# Patient Record
Sex: Female | Born: 1995 | Race: Black or African American | Hispanic: No | Marital: Single | Smoking: Current some day smoker
Health system: Southern US, Community
[De-identification: ages and names within clinical notes are randomized; demographics above are authoritative.]

## PROBLEM LIST (undated history)

## (undated) DIAGNOSIS — J45909 Unspecified asthma, uncomplicated: Secondary | ICD-10-CM

## (undated) HISTORY — PX: KNEE ARTHROSCOPY: SUR90

---
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
## 2015-12-30 IMAGING — CR DG KNEE COMPLETE 4+V*L*
4 series · 4 of 4 positions shown · non-contrast
Comparison: 10/22/2010

CLINICAL DATA: Left knee pain following tripping injury today,
initial encounter

EXAM:
LEFT KNEE - COMPLETE 4+ VIEW

[x knee ap left (1 of 3)]
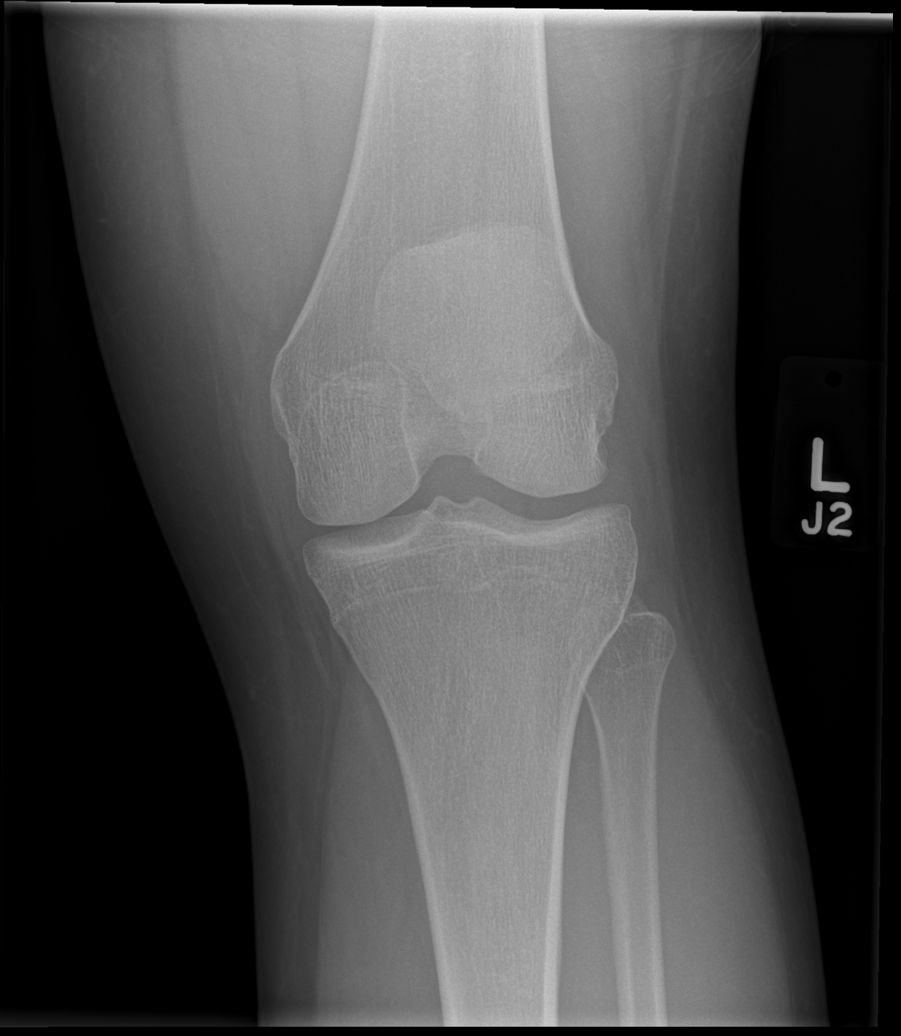

[x knee ap left (2 of 3)]
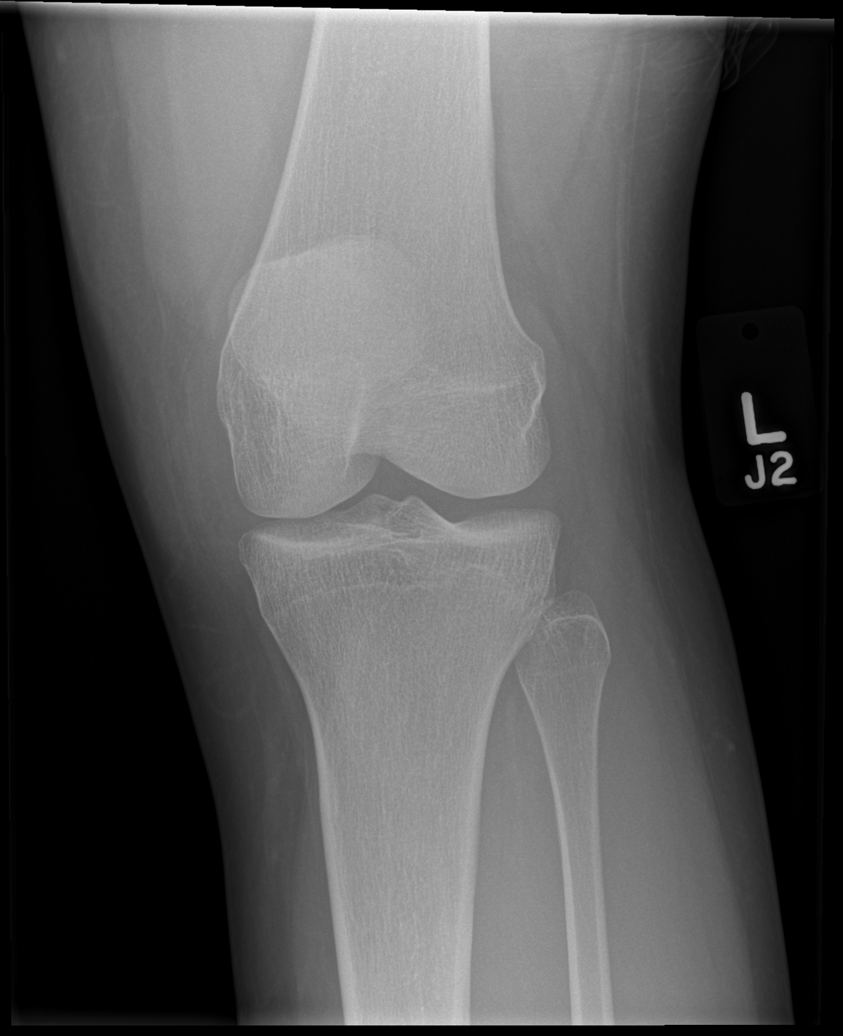

[x knee ap left (3 of 3)]
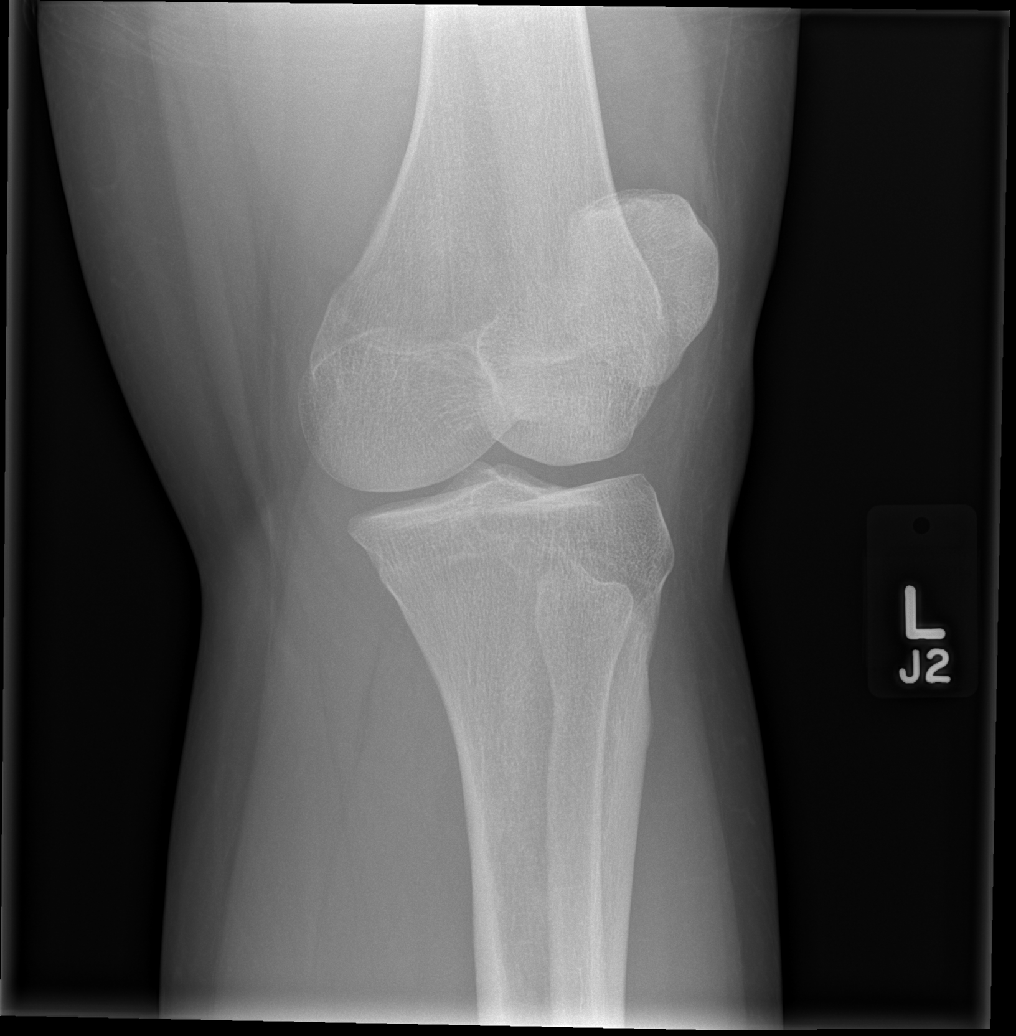

[x knee lat left]
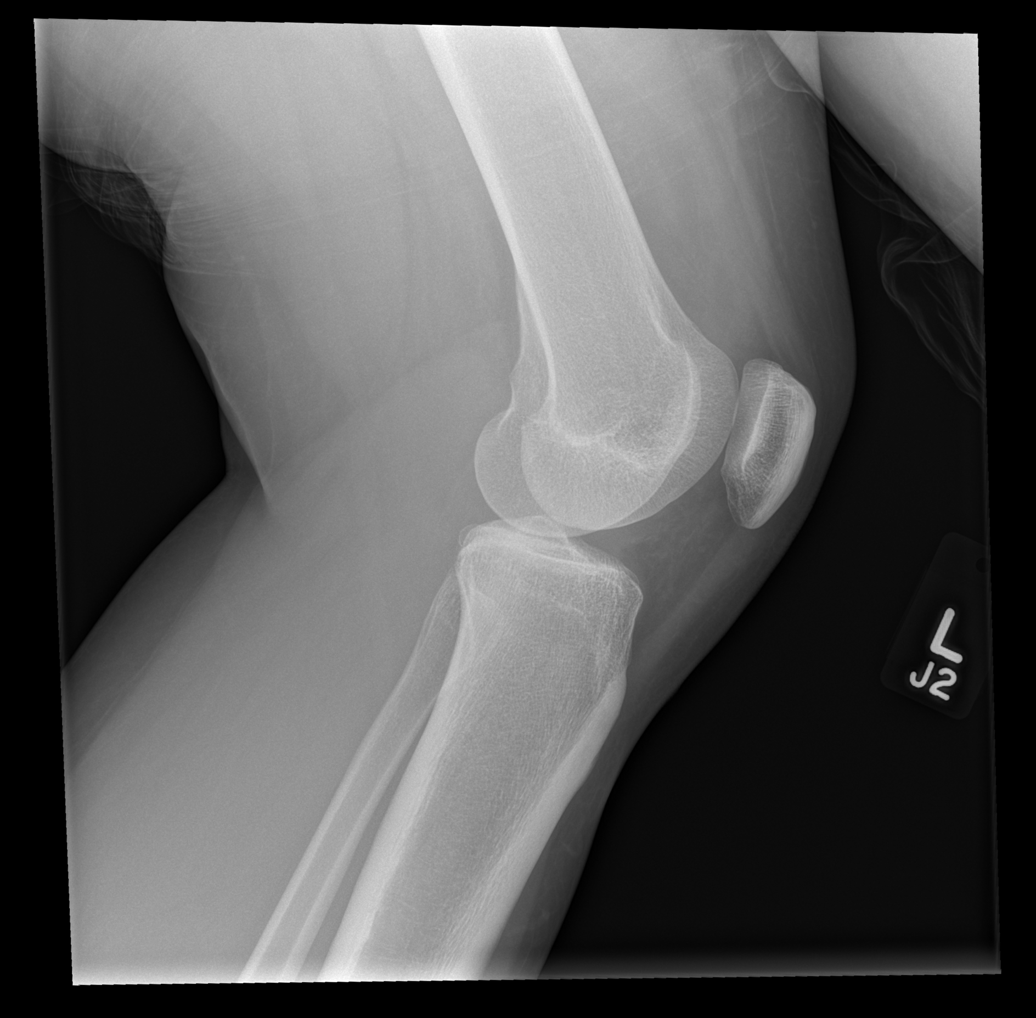

[4 of 4 positions shown; findings below may reference images not displayed]

FINDINGS: There is no evidence of fracture, dislocation, or joint effusion.
There is no evidence of arthropathy or other focal bone abnormality.
Soft tissues are unremarkable.
IMPRESSION: No acute abnormality noted.

## 2016-02-10 ENCOUNTER — Encounter (HOSPITAL_COMMUNITY): Payer: Self-pay

## 2016-02-10 DIAGNOSIS — F17200 Nicotine dependence, unspecified, uncomplicated: Secondary | ICD-10-CM | POA: Diagnosis not present

## 2016-02-10 DIAGNOSIS — R05 Cough: Secondary | ICD-10-CM | POA: Insufficient documentation

## 2016-02-10 DIAGNOSIS — R112 Nausea with vomiting, unspecified: Secondary | ICD-10-CM | POA: Insufficient documentation

## 2016-02-10 DIAGNOSIS — R740 Nonspecific elevation of levels of transaminase and lactic acid dehydrogenase [LDH]: Secondary | ICD-10-CM | POA: Insufficient documentation

## 2016-02-10 LAB — POC URINE PREG, ED: PREG TEST UR: NEGATIVE

## 2016-02-10 MED ORDER — ONDANSETRON 4 MG PO TBDP
ORAL_TABLET | ORAL | Status: AC
  Filled 2016-02-10: qty 1

## 2016-02-10 MED ORDER — ONDANSETRON 4 MG PO TBDP
4.0000 mg | ORAL_TABLET | Freq: Once | ORAL | Status: AC | PRN
  Administered 2016-02-10: 4 mg via ORAL

## 2016-02-10 NOTE — ED Triage Notes (Signed)
Pt states she has been having productive cough x 2 weeks; pt states OTC has not worked; Pt believes she has the flu; pt c/o body aches and hot flashes; Pt states pain at 7/10 on arrival. Pt a&ox 4; pt states vomiting x 4 in 24 hours.

## 2016-02-11 ENCOUNTER — Emergency Department (HOSPITAL_COMMUNITY): Payer: BLUE CROSS/BLUE SHIELD

## 2016-02-11 ENCOUNTER — Emergency Department (HOSPITAL_COMMUNITY)
Admission: EM | Admit: 2016-02-11 | Discharge: 2016-02-11 | Disposition: A | Discharge status: Home / Self Care | Payer: BLUE CROSS/BLUE SHIELD | Attending: Emergency Medicine | Admitting: Emergency Medicine

## 2016-02-11 DIAGNOSIS — R7401 Elevation of levels of liver transaminase levels: Secondary | ICD-10-CM

## 2016-02-11 DIAGNOSIS — R059 Cough, unspecified: Secondary | ICD-10-CM

## 2016-02-11 DIAGNOSIS — R05 Cough: Secondary | ICD-10-CM

## 2016-02-11 DIAGNOSIS — R740 Nonspecific elevation of levels of transaminase and lactic acid dehydrogenase [LDH]: Secondary | ICD-10-CM

## 2016-02-11 DIAGNOSIS — R110 Nausea: Secondary | ICD-10-CM

## 2016-02-11 DIAGNOSIS — R6889 Other general symptoms and signs: Secondary | ICD-10-CM

## 2016-02-11 LAB — COMPREHENSIVE METABOLIC PANEL
ALK PHOS: 47 U/L (ref 38–126)
ALT: 140 U/L — ABNORMAL HIGH (ref 14–54)
AST: 199 U/L — AB (ref 15–41)
Albumin: 4 g/dL (ref 3.5–5.0)
Anion gap: 12 (ref 5–15)
BILIRUBIN TOTAL: 0.6 mg/dL (ref 0.3–1.2)
BUN: 9 mg/dL (ref 6–20)
CALCIUM: 9.5 mg/dL (ref 8.9–10.3)
CO2: 27 mmol/L (ref 22–32)
Chloride: 101 mmol/L (ref 101–111)
Creatinine, Ser: 1.12 mg/dL — ABNORMAL HIGH (ref 0.44–1.00)
GFR calc Af Amer: >60 mL/min (ref >60)
GFR calc non Af Amer: >60 mL/min (ref >60)
Glucose, Bld: 106 mg/dL — ABNORMAL HIGH (ref 65–99)
POTASSIUM: 3.6 mmol/L (ref 3.5–5.1)
Sodium: 140 mmol/L (ref 135–145)
TOTAL PROTEIN: 7.1 g/dL (ref 6.5–8.1)

## 2016-02-11 LAB — CBC
HEMATOCRIT: 36.8 % (ref 36.0–46.0)
Hemoglobin: 12.6 g/dL (ref 12.0–15.0)
MCH: 29.6 pg (ref 26.0–34.0)
MCHC: 34.2 g/dL (ref 30.0–36.0)
MCV: 86.4 fL (ref 78.0–100.0)
PLATELETS: 191 10*3/uL (ref 150–400)
RBC: 4.26 MIL/uL (ref 3.87–5.11)
RDW: 13.9 % (ref 11.5–15.5)
WBC: 5 10*3/uL (ref 4.0–10.5)

## 2016-02-11 MED ORDER — ONDANSETRON HCL 4 MG/2ML IJ SOLN
4.0000 mg | Freq: Once | INTRAMUSCULAR | Status: AC
  Administered 2016-02-11: 4 mg via INTRAVENOUS
  Filled 2016-02-11: qty 2

## 2016-02-11 MED ORDER — SODIUM CHLORIDE 0.9 % IV BOLUS (SEPSIS)
1000.0000 mL | Freq: Once | INTRAVENOUS | Status: AC
  Administered 2016-02-11: 1000 mL via INTRAVENOUS

## 2016-02-11 MED ORDER — ONDANSETRON HCL 4 MG PO TABS: 4.0000 mg | ORAL_TABLET | Freq: Four times a day (QID) | ORAL | 0 refills | Status: DC | PRN

## 2016-02-11 NOTE — Discharge Instructions (Signed)
Your blood tests showed elevations of some enzymes from the liver. This may indicate hepatitis. Hepatitis could be from the flu, but you have had blood drawn to look for other causes of hepatitis. Please make sure to take plenty of fluids. Take the ondansetron as needed for nausea. You may take ibuprofen or naproxen as needed for fever or aching. Do not take acetaminophen (Tylenol) because that can be harmful to the liver. Please be careful, because many cough, cold, flu medications have acetaminophen in them. Do not take any medications which contain acetaminophen. Follow-up with your doctor next week to evaluate the hepatitis tests which were drawn today. Return if your symptoms are getting worse.

## 2016-02-11 NOTE — ED Provider Notes (Signed)
[facility]-EMERGENCY DEPT Provider Note   CSN: [id number] Arrival date & time: 02/10/16  2313  By signing my name below, I, Freida Busman, attest that this documentation has been prepared under the direction and in the presence of Dione Booze, MD . Electronically Signed: Freida Busman, Scribe. 02/11/2016. 1:30 AM.  History   Chief Complaint Chief Complaint  Patient presents with  . Influenza    The history is provided by the patient and medical records. No language interpreter was used.    HPI Comments:  Caroline Richardson is a 21 y.o. female who presents to the Emergency Department complaining of a productive cough with grey colored phlegm x 2 weeks. She reports associated nausea, vomiting, chills, subjective fever, generalized  body aches, and decreased appetite. No recent sick contacts. No alleviating factors noted.    History reviewed. No pertinent past medical history.  There are no active problems to display for this patient.   Past Surgical History:  Procedure Laterality Date  . ARTHROSCOPIC REPAIR ACL Right 2012    OB History    No data available       Home Medications    Prior to Admission medications   Medication Sig Start Date End Date Taking? Authorizing Provider  azithromycin (ZITHROMAX) 250 MG tablet Take 1 tablet (250 mg total) by mouth daily. Take first 2 tablets together, then 1 every day until finished. 12/31/14   Stevi Barrett, PA-C  Chlorphen-Pseudoephed-APAP (THERAFLU FLU/COLD PO) Take by mouth as directed.    Historical Provider, MD  guaifenesin (ROBITUSSIN) 100 MG/5ML syrup Take 200 mg by mouth 3 (three) times daily as needed for cough.    Historical Provider, MD    Family History No family history on file.  Social History Social History  Substance Use Topics  . Smoking status: Current Some Day Smoker  . Smokeless tobacco: Not on file  . Alcohol use No     Allergies   Patient has no known allergies.   Review of Systems Review of Systems    Constitutional: Positive for appetite change, chills and fever.  Respiratory: Positive for cough.   Gastrointestinal: Positive for nausea and vomiting.  Musculoskeletal: Positive for myalgias.  All other systems reviewed and are negative.   Physical Exam Updated Vital Signs BP 96/66 (BP Location: Right Arm)   Pulse 108   Temp 100.1 F (37.8 C) (Oral)   Resp 18   LMP 01/25/2016   SpO2 100%   Physical Exam  Constitutional: She is oriented to person, place, and time. She appears well-developed and well-nourished.  HENT:  Head: Normocephalic and atraumatic.  Tender over frontal and maxillay sinuses   Eyes: EOM are normal. Pupils are equal, round, and reactive to light.  Neck: Normal range of motion. Neck supple. No JVD present.  Cardiovascular: Regular rhythm and normal heart sounds.  Tachycardia present.   No murmur heard. Pulmonary/Chest: Effort normal and breath sounds normal. She has no wheezes. She has no rales. She exhibits no tenderness.  Abdominal: Soft. Bowel sounds are normal. She exhibits no distension and no mass. There is no tenderness.  Musculoskeletal: Normal range of motion. She exhibits no edema.  Lymphadenopathy:    She has no cervical adenopathy.  Neurological: She is alert and oriented to person, place, and time. No cranial nerve deficit. She exhibits normal muscle tone. Coordination normal.  Skin: Skin is warm and dry. No rash noted.  Psychiatric: She has a normal mood and affect. Her behavior is normal. Judgment and thought content  normal.  Nursing note and vitals reviewed.    ED Treatments / Results  DIAGNOSTIC STUDIES:  Oxygen Saturation is 100% on RA, normal by my interpretation.    COORDINATION OF CARE:  1:28 AM Discussed treatment plan with pt at bedside and pt agreed to plan.  Labs (all labs ordered are listed, but only abnormal results are displayed) Labs Reviewed  COMPREHENSIVE METABOLIC PANEL - Abnormal; Notable for the following:        Result Value   Glucose, Bld 106 (*)    Creatinine, Ser 1.12 (*)    AST 199 (*)    ALT 140 (*)    All other components within normal limits  CBC  POC URINE PREG, ED    Radiology Dg Chest 2 View  Result Date: 02/11/2016 CLINICAL DATA:  Productive cough for 2 weeks. EXAM: CHEST  2 VIEW COMPARISON:  12/31/2014 FINDINGS: The heart size and mediastinal contours are within normal limits. Both lungs are clear. The visualized skeletal structures are unremarkable. IMPRESSION: No active cardiopulmonary disease. Electronically Signed   By: Ellery Plunkaniel R Mitchell M.D.   On: 02/11/2016 01:58    Procedures Procedures (including critical care time)  Medications Ordered in ED Medications  ondansetron (ZOFRAN-ODT) 4 MG disintegrating tablet (not administered)  ondansetron (ZOFRAN-ODT) disintegrating tablet 4 mg (4 mg Oral Given 02/10/16 2331)     Initial Impression / Assessment and Plan / ED Course  I have reviewed the triage vital signs and the nursing notes.  Pertinent labs & imaging results that were available during my care of the patient were reviewed by me and considered in my medical decision making (see chart for details).  Flulike illness with persistent cough. Chest x-ray shows no evidence of pneumonia. Screening labs have been ordered prior to my seeing the patient and she does have modest elevation of AST and ALT. This could conceivably be from influenza, but she needs to be evaluated for possible viral hepatitis. Hepatitis panel has been drawn per G is given a bolus of intravenous fluids and ondansetron with significant subjective improvement. She is discharged with prescription for ondansetron and is to follow-up with her PCP to discuss results of hepatitis panel. Old records are reviewed, and she does have a prior ED visit for an upper respiratory infection 2 months ago. No previous liver enzymes on record.  Final Clinical Impressions(s) / ED Diagnoses   Final diagnoses:  Flu-like symptoms   Cough  Nausea  Elevated transaminase level    New Prescriptions New Prescriptions   No medications on file   I personally performed the services described in this documentation, which was scribed in my presence. The recorded information has been reviewed and is accurate.       Dione Boozeavid Safa Derner, MD 02/11/16 [phone]

## 2016-02-12 LAB — HEPATITIS PANEL, ACUTE
HCV Ab: <0.1 s/co ratio (ref 0.0–0.9)
Hep A IgM: NEGATIVE
Hep B C IgM: NEGATIVE
Hepatitis B Surface Ag: NEGATIVE

## 2016-03-09 ENCOUNTER — Telehealth (INDEPENDENT_AMBULATORY_CARE_PROVIDER_SITE_OTHER): Payer: Self-pay | Admitting: Orthopedic Surgery

## 2016-03-09 NOTE — Telephone Encounter (Signed)
I received vm from patients mother Caroline Richardson 2/23 checking on records request she stated she dropped off form "12" days ago.  [employer] doesn't have a release form.  I returned call 2/26 and left msg on vm asking to rmc

## 2017-02-07 IMAGING — CR DG CHEST 2V
2 series · 2 of 2 positions shown · non-contrast
Comparison: Physis 23/20 16; 02/25/2013; 03/03/2008

CLINICAL DATA: Cough congestion and generalized chest pain for 1
month. History of smoking.

EXAM:
CHEST  2 VIEW

[w chest pa]
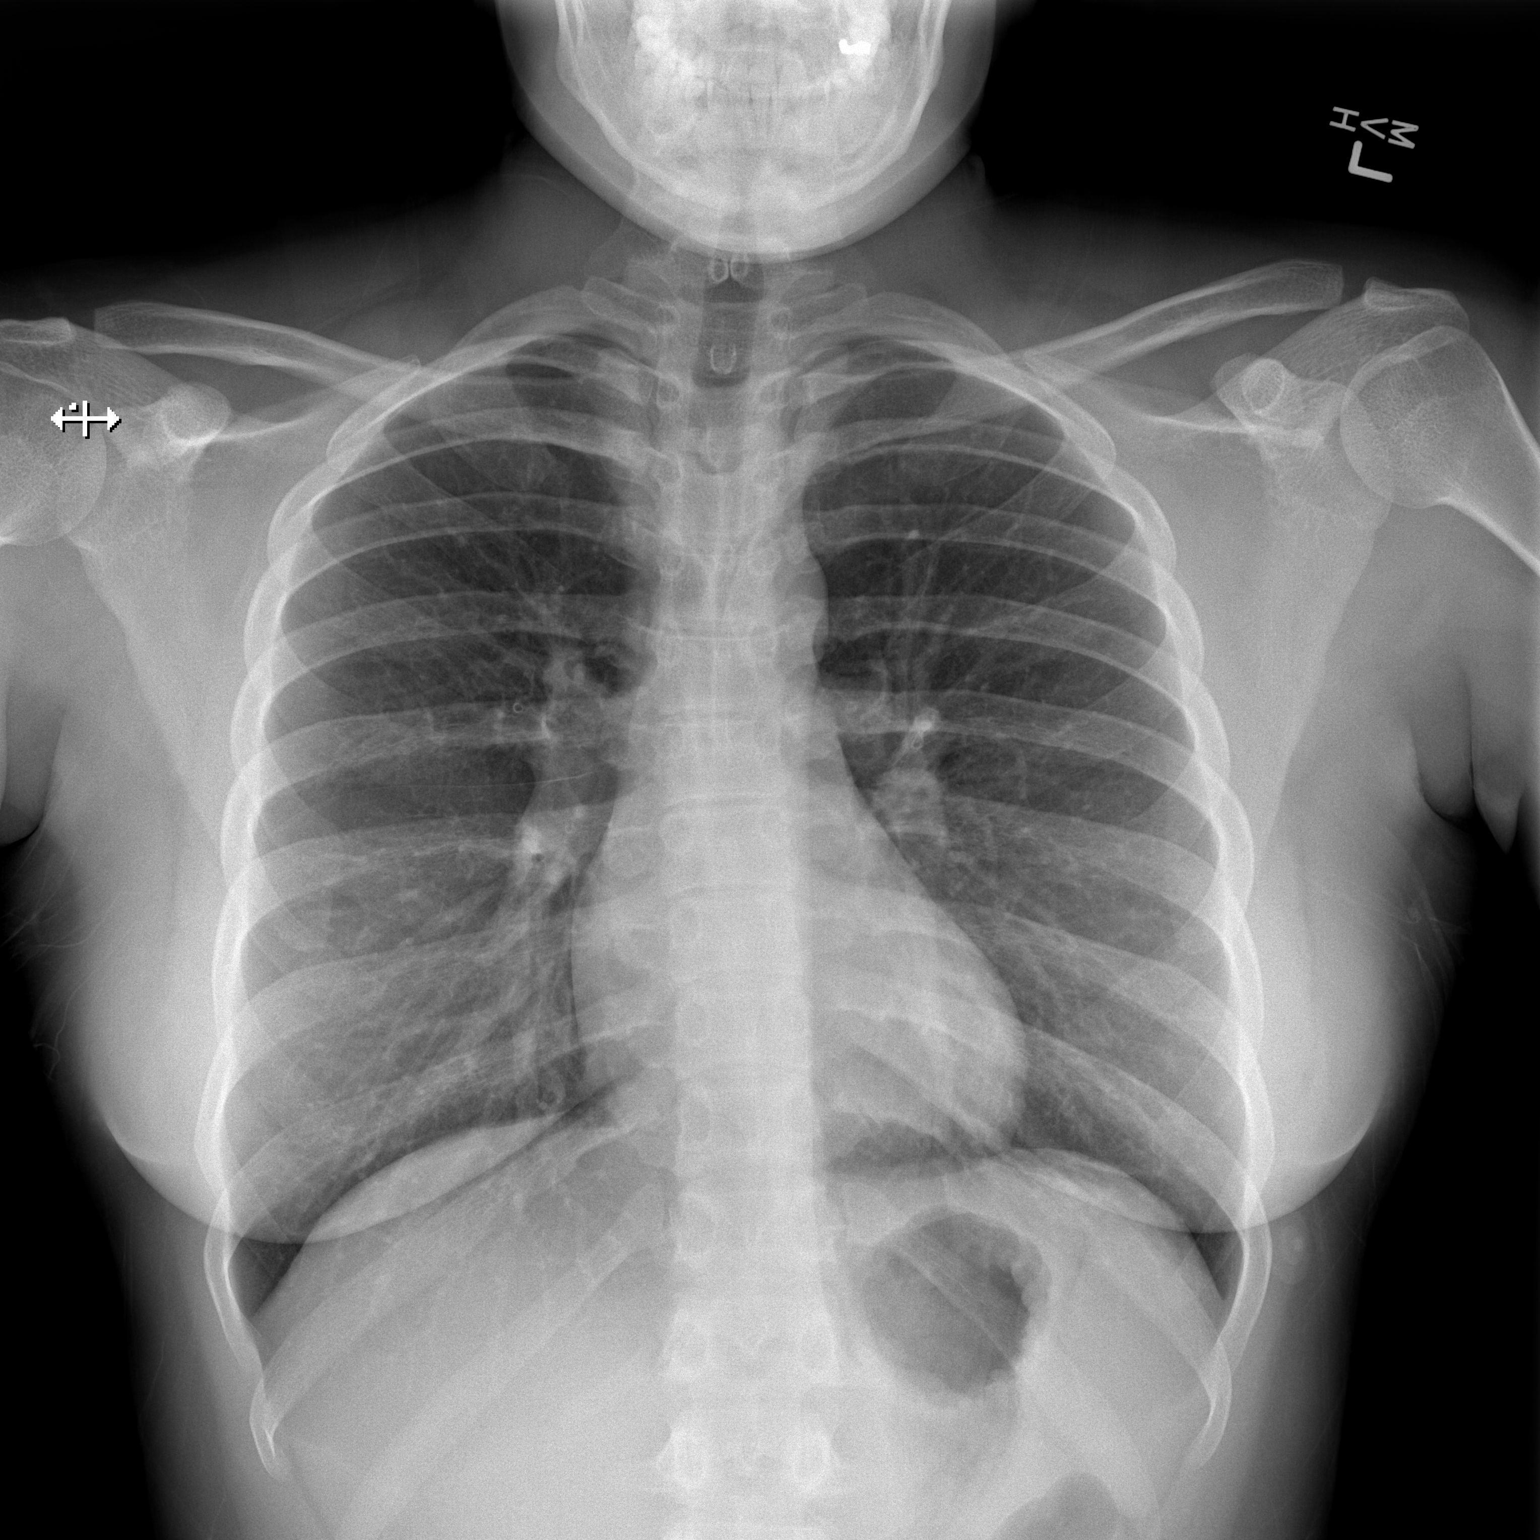

[w chest lat]
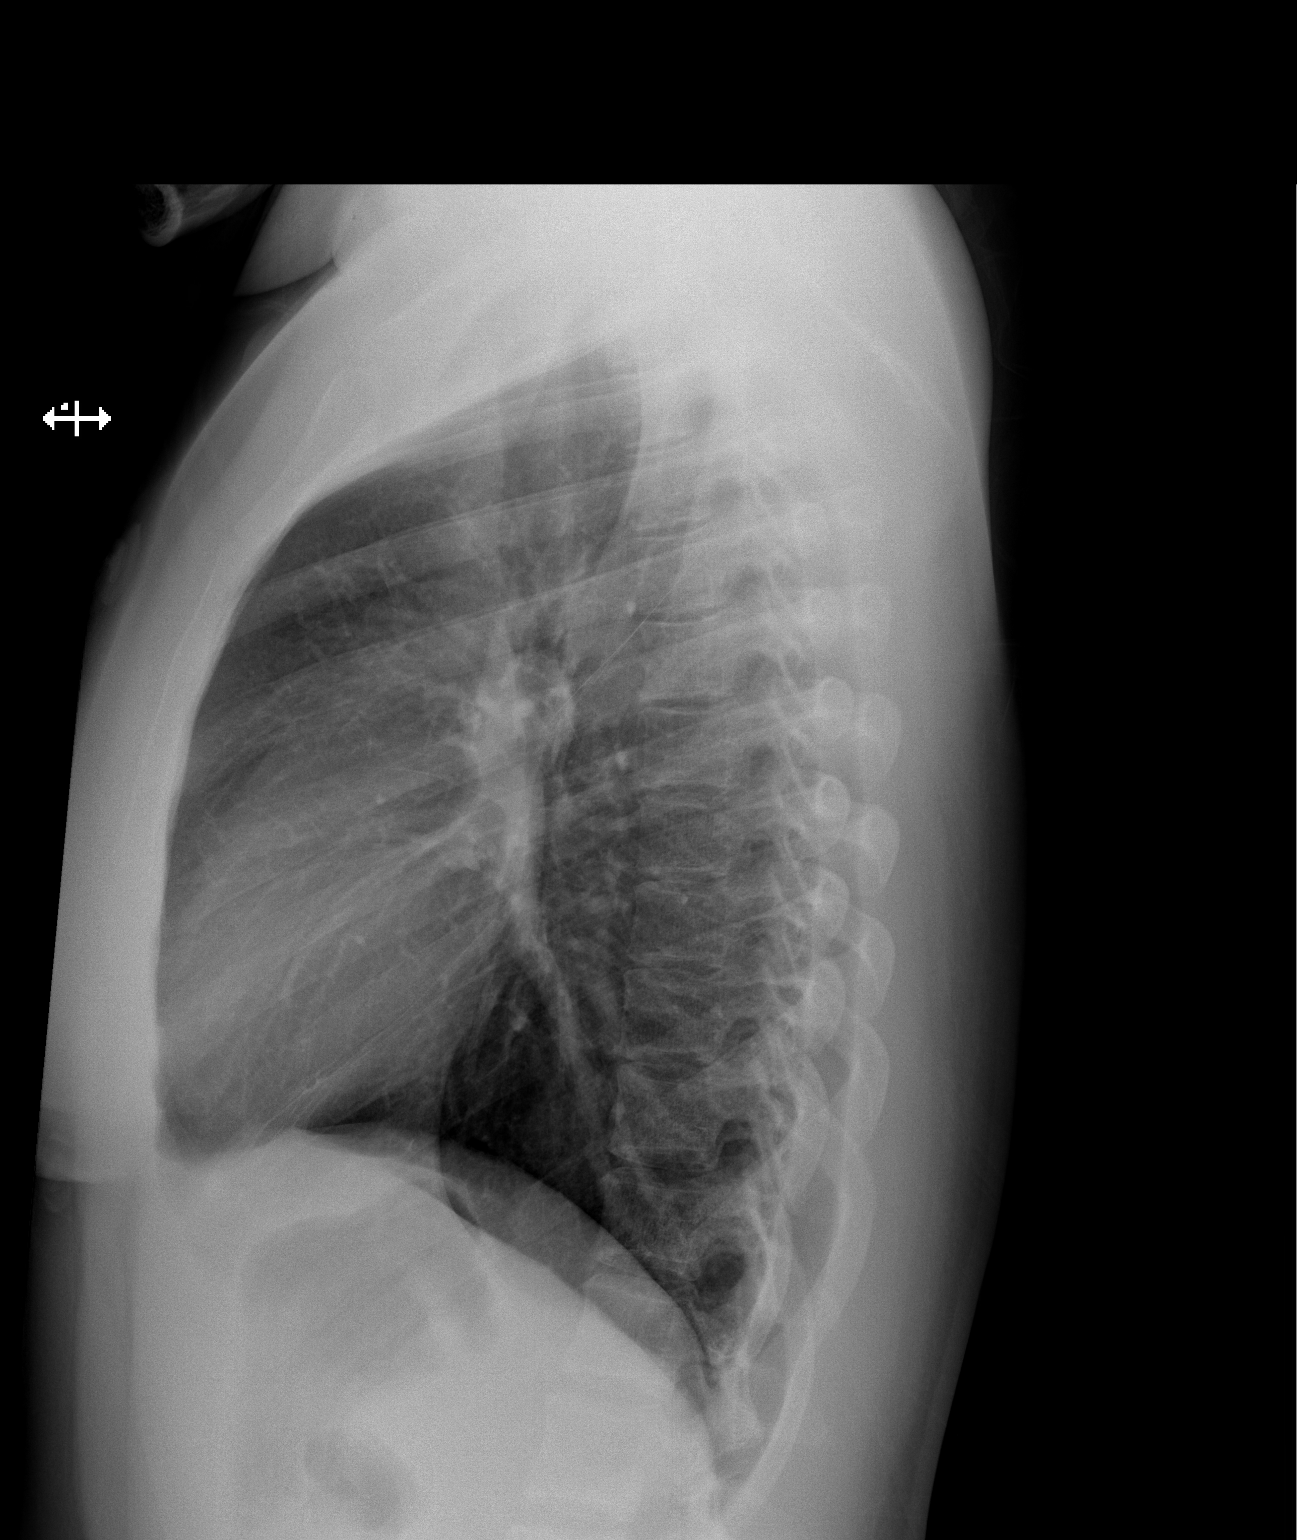

[2 of 2 positions shown; findings below may reference images not displayed]

FINDINGS: Grossly unchanged cardiac silhouette and mediastinal contours. No
focal parenchymal opacities. No pleural effusion or pneumothorax. No
evidence of edema. No acute osseus abnormalities. There is suspected
congenital non fusion involving the posterior elements of the
approximate C7 vertebral body.
IMPRESSION: No acute cardiopulmonary disease. Specifically, no evidence of
pneumonia.

## 2017-12-05 ENCOUNTER — Emergency Department (HOSPITAL_COMMUNITY): Payer: Self-pay

## 2017-12-05 ENCOUNTER — Other Ambulatory Visit: Payer: Self-pay

## 2017-12-05 ENCOUNTER — Encounter (HOSPITAL_COMMUNITY): Payer: Self-pay | Admitting: Family Medicine

## 2017-12-05 DIAGNOSIS — F17290 Nicotine dependence, other tobacco product, uncomplicated: Secondary | ICD-10-CM | POA: Insufficient documentation

## 2017-12-05 DIAGNOSIS — J40 Bronchitis, not specified as acute or chronic: Secondary | ICD-10-CM | POA: Insufficient documentation

## 2017-12-05 DIAGNOSIS — Z79899 Other long term (current) drug therapy: Secondary | ICD-10-CM | POA: Insufficient documentation

## 2017-12-05 DIAGNOSIS — R0789 Other chest pain: Secondary | ICD-10-CM | POA: Insufficient documentation

## 2017-12-05 LAB — I-STAT BETA HCG BLOOD, ED (NOT ORDERABLE)

## 2017-12-05 LAB — POCT I-STAT TROPONIN I: Troponin i, poc: 0 ng/mL (ref 0.00–0.08)

## 2017-12-05 LAB — CBC
HEMATOCRIT: 38.6 % (ref 36.0–46.0)
Hemoglobin: 12.7 g/dL (ref 12.0–15.0)
MCH: 30 pg (ref 26.0–34.0)
MCHC: 32.9 g/dL (ref 30.0–36.0)
MCV: 91.3 fL (ref 80.0–100.0)
NRBC: 0 % (ref 0.0–0.2)
Platelets: 242 10*3/uL (ref 150–400)
RBC: 4.23 MIL/uL (ref 3.87–5.11)
RDW: 12.7 % (ref 11.5–15.5)
WBC: 6.2 10*3/uL (ref 4.0–10.5)

## 2017-12-05 NOTE — ED Triage Notes (Signed)
Patient is complaining of intermittent, mid-sternal chest pain that started on Friday. Describes pain as pressure with no radiation. Associated symptoms are shortness of breath, back pain, lightheadedness, and diaphoresis. Risk factors of smoking history. Reports taking Tylenol and Ibuprofen with mild relief. Patient appears in acute distress and able to ambulate with a steady gait from the lobby to triage room 2.

## 2017-12-06 ENCOUNTER — Emergency Department (HOSPITAL_COMMUNITY)
Admission: EM | Admit: 2017-12-06 | Discharge: 2017-12-06 | Disposition: A | Discharge status: Home / Self Care | Payer: Self-pay | Attending: Emergency Medicine | Admitting: Emergency Medicine

## 2017-12-06 DIAGNOSIS — R0789 Other chest pain: Secondary | ICD-10-CM

## 2017-12-06 DIAGNOSIS — J40 Bronchitis, not specified as acute or chronic: Secondary | ICD-10-CM

## 2017-12-06 LAB — BASIC METABOLIC PANEL
Anion gap: 6 (ref 5–15)
BUN: 12 mg/dL (ref 6–20)
CHLORIDE: 109 mmol/L (ref 98–111)
CO2: 27 mmol/L (ref 22–32)
Calcium: 9 mg/dL (ref 8.9–10.3)
Creatinine, Ser: 1.04 mg/dL — ABNORMAL HIGH (ref 0.44–1.00)
GFR calc non Af Amer: >60 mL/min (ref >60)
Glucose, Bld: 108 mg/dL — ABNORMAL HIGH (ref 70–99)
POTASSIUM: 3.8 mmol/L (ref 3.5–5.1)
SODIUM: 142 mmol/L (ref 135–145)

## 2017-12-06 MED ORDER — ALBUTEROL SULFATE HFA 108 (90 BASE) MCG/ACT IN AERS: 2.0000 | INHALATION_SPRAY | RESPIRATORY_TRACT | 0 refills | Status: DC | PRN

## 2017-12-06 MED ORDER — NAPROXEN 500 MG PO TABS
500.0000 mg | ORAL_TABLET | Freq: Once | ORAL | Status: AC
  Administered 2017-12-06: 500 mg via ORAL
  Filled 2017-12-06: qty 1

## 2017-12-06 MED ORDER — NAPROXEN 500 MG PO TABS: 500.0000 mg | ORAL_TABLET | Freq: Two times a day (BID) | ORAL | 0 refills | Status: DC

## 2017-12-06 MED ORDER — ALBUTEROL SULFATE HFA 108 (90 BASE) MCG/ACT IN AERS
2.0000 | INHALATION_SPRAY | Freq: Once | RESPIRATORY_TRACT | Status: AC
  Administered 2017-12-06: 2 via RESPIRATORY_TRACT
  Filled 2017-12-06: qty 6.7

## 2017-12-06 NOTE — ED Provider Notes (Signed)
[facility]-EMERGENCY DEPT Provider Note   CSN: [id number] Arrival date & time: 12/05/17  2226     History   Chief Complaint Chief Complaint  Patient presents with  . Chest Pain    HPI Caroline Richardson is a 22 y.o. female.  HPI  This is a 22 year old female who presents with chest pain.  Patient reports onset of anterior nonradiating chest pain on Friday.  It is intermittent.  She describes it as pressure at times.  He has developed a nonproductive cough.  No fevers.  Denies any leg swelling or history of blood clots.  She has taken some over-the-counter medications with some relief including ibuprofen.  Currently she rates her pain at 5 out of 10.  Denies any worsening of pain with deep breathing.  She states that she has had some shortness of breath with her upper respiratory symptoms.  She is concerned she may have pneumonia.  She is a smoker.  History reviewed. No pertinent past medical history.  There are no active problems to display for this patient.   Past Surgical History:  Procedure Laterality Date  . ARTHROSCOPIC REPAIR ACL Right 2012     OB History   None      Home Medications    Prior to Admission medications   Medication Sig Start Date End Date Taking? Authorizing Provider  albuterol (PROVENTIL HFA;VENTOLIN HFA) 108 (90 Base) MCG/ACT inhaler Inhale 2 puffs into the lungs every 4 (four) hours as needed for wheezing or shortness of breath. 12/06/17   Horton, Mayer Maskerourtney F, MD  azithromycin (ZITHROMAX) 250 MG tablet Take 1 tablet (250 mg total) by mouth daily. Take first 2 tablets together, then 1 every day until finished. 12/31/14   Barrett, Rolm GalaStevi, PA-C  Chlorphen-Pseudoephed-APAP (THERAFLU FLU/COLD PO) Take by mouth as directed.    [provider]  guaifenesin (ROBITUSSIN) 100 MG/5ML syrup Take 200 mg by mouth 3 (three) times daily as needed for cough.    [provider]  naproxen (NAPROSYN) 500 MG tablet Take 1 tablet  (500 mg total) by mouth 2 (two) times daily. 12/06/17   Horton, Mayer Maskerourtney F, MD  ondansetron (ZOFRAN) 4 MG tablet Take 1 tablet (4 mg total) by mouth every 6 (six) hours as needed for nausea or vomiting. 02/11/16   Dione BoozeGlick, David, MD    Family History History reviewed. No pertinent family history.  Social History Social History   Tobacco Use  . Smoking status: Current Some Day Smoker    Types: Cigars  . Smokeless tobacco: Never Used  Substance Use Topics  . Alcohol use: No  . Drug use: No     Allergies   Patient has no known allergies.   Review of Systems Review of Systems  Constitutional: Negative for fever.  Respiratory: Positive for cough and shortness of breath.   Cardiovascular: Positive for chest pain. Negative for leg swelling.  Gastrointestinal: Negative for abdominal pain, nausea and vomiting.  Genitourinary: Negative for dysuria.  Neurological: Negative for weakness and numbness.  All other systems reviewed and are negative.    Physical Exam Updated Vital Signs BP 98/75 (BP Location: Left Arm)   Pulse 71   Temp 98 F (36.7 C) (Oral)   Resp 17   Ht 1.676 m (5\' 6" )   Wt 72.6 kg   SpO2 100%   BMI 25.82 kg/m   Physical Exam  Constitutional: She is oriented to person, place, and time. She appears well-developed and well-nourished. No distress.  HENT:  Head: Normocephalic and atraumatic.  Eyes: Pupils are equal, round, and reactive to light.  Neck: Neck supple.  Cardiovascular: Normal rate, regular rhythm, normal heart sounds and normal pulses.  Pulmonary/Chest: Effort normal. No respiratory distress. She has no wheezes.  Abdominal: Soft. Bowel sounds are normal. There is no tenderness.  Musculoskeletal:       Right lower leg: She exhibits no tenderness and no edema.       Left lower leg: She exhibits no tenderness and no edema.  Neurological: She is alert and oriented to person, place, and time.  Skin: Skin is warm and dry.  Psychiatric: She has a  normal mood and affect.  Nursing note and vitals reviewed.    ED Treatments / Results  Labs (all labs ordered are listed, but only abnormal results are displayed) Labs Reviewed  BASIC METABOLIC PANEL - Abnormal; Notable for the following components:      Result Value   Glucose, Bld 108 (*)    Creatinine, Ser 1.04 (*)    All other components within normal limits  CBC  I-STAT TROPONIN, ED  I-STAT BETA HCG BLOOD, ED ([facility], [facility], [facility] ONLY)  POCT I-STAT TROPONIN I  I-STAT BETA HCG BLOOD, ED (NOT ORDERABLE)    EKG EKG Interpretation  Date/Time:  Monday December 05 2017 23:28:59 EST Ventricular Rate:  73 PR Interval:    QRS Duration: 93 QT Interval:  366 QTC Calculation: 404 R Axis:   43 Text Interpretation:  Sinus rhythm RSR' in V1 or V2, probably normal variant Borderline T abnormalities, anterior leads Baseline wander in lead(s) V2 Nonspecific T wave abnormality Confirmed by Ross Marcus ([id number]) on 12/06/2017 1:50:03 AM   Radiology Dg Chest 2 View  Result Date: 12/06/2017 CLINICAL DATA:  Chest pain EXAM: CHEST - 2 VIEW COMPARISON:  02/11/2016 FINDINGS: The heart size and mediastinal contours are within normal limits. Both lungs are clear. The visualized skeletal structures are unremarkable. IMPRESSION: No active cardiopulmonary disease. Electronically Signed   By: Jasmine Pang M.D.   On: 12/06/2017 00:02    Procedures Procedures (including critical care time)  Medications Ordered in ED Medications  naproxen (NAPROSYN) tablet 500 mg (has no administration in time range)  albuterol (PROVENTIL HFA;VENTOLIN HFA) 108 (90 Base) MCG/ACT inhaler 2 puff (has no administration in time range)     Initial Impression / Assessment and Plan / ED Course  I have reviewed the triage vital signs and the nursing notes.  Pertinent labs & imaging results that were available during my care of the patient were reviewed by me and considered in my medical decision making (see chart for  details).     Patient presents with chest pain.  Ongoing since Friday.  She is overall nontoxic-appearing vital signs are reassuring.  She is on a percent on room air.  No respiratory distress.  Does report some upper respiratory symptoms including cough.  She is a smoker.  Her physical exam is benign.  EKG shows no signs of ischemia or arrhythmia.  Chest x-ray shows no evidence of pneumothorax or pneumonia.  Troponin is negative.  Lab work-up is otherwise unremarkable.  Suspect upper respiratory infection likely viral versus bronchitis.  She is low risk and PERC negative so doubt PE.  She was given naproxen and an inhaler given her smoking history.  Will treat supportively.  Patient stated understanding.  After history, exam, and medical workup I feel the patient has been appropriately medically screened and is safe for discharge home. Pertinent diagnoses  were discussed with the patient. Patient was given return precautions.   Final Clinical Impressions(s) / ED Diagnoses   Final diagnoses:  Atypical chest pain  Bronchitis    ED Discharge Orders         Ordered    naproxen (NAPROSYN) 500 MG tablet  2 times daily     12/06/17 0224    albuterol (PROVENTIL HFA;VENTOLIN HFA) 108 (90 Base) MCG/ACT inhaler  Every 4 hours PRN     12/06/17 0224           Shon Baton, MD 12/06/17 [phone]

## 2017-12-06 NOTE — Discharge Instructions (Addendum)
You were seen today for chest pain.  Your work-up is largely reassuring.  You may have some bronchospasm with bronchitis given recent cough.  Use inhaler as needed.  Take naproxen as needed for pain.

## 2018-01-28 ENCOUNTER — Encounter (HOSPITAL_COMMUNITY): Payer: Self-pay

## 2018-01-28 ENCOUNTER — Other Ambulatory Visit: Payer: Self-pay

## 2018-01-28 ENCOUNTER — Emergency Department (HOSPITAL_COMMUNITY)
Admission: EM | Admit: 2018-01-28 | Discharge: 2018-01-28 | Disposition: A | Discharge status: Home / Self Care | Payer: No Typology Code available for payment source | Attending: Emergency Medicine | Admitting: Emergency Medicine

## 2018-01-28 DIAGNOSIS — F17290 Nicotine dependence, other tobacco product, uncomplicated: Secondary | ICD-10-CM | POA: Diagnosis not present

## 2018-01-28 DIAGNOSIS — R51 Headache: Secondary | ICD-10-CM | POA: Insufficient documentation

## 2018-01-28 DIAGNOSIS — R519 Headache, unspecified: Secondary | ICD-10-CM

## 2018-01-28 LAB — POC URINE PREG, ED: Preg Test, Ur: NEGATIVE

## 2018-01-28 MED ORDER — METHOCARBAMOL 500 MG PO TABS: 500.0000 mg | ORAL_TABLET | Freq: Two times a day (BID) | ORAL | 0 refills | Status: DC

## 2018-01-28 MED ORDER — KETOROLAC TROMETHAMINE 60 MG/2ML IM SOLN
60.0000 mg | Freq: Once | INTRAMUSCULAR | Status: AC
  Administered 2018-01-28: 60 mg via INTRAMUSCULAR
  Filled 2018-01-28: qty 2
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
## 2018-03-05 ENCOUNTER — Encounter (HOSPITAL_COMMUNITY): Payer: Self-pay

## 2018-03-05 ENCOUNTER — Other Ambulatory Visit: Payer: Self-pay

## 2018-03-05 ENCOUNTER — Emergency Department (HOSPITAL_COMMUNITY): Payer: BLUE CROSS/BLUE SHIELD

## 2018-03-05 ENCOUNTER — Emergency Department (HOSPITAL_COMMUNITY)
Admission: EM | Admit: 2018-03-05 | Discharge: 2018-03-05 | Disposition: A | Discharge status: Home / Self Care | Payer: BLUE CROSS/BLUE SHIELD | Attending: Emergency Medicine | Admitting: Emergency Medicine

## 2018-03-05 DIAGNOSIS — Y998 Other external cause status: Secondary | ICD-10-CM | POA: Insufficient documentation

## 2018-03-05 DIAGNOSIS — Y9389 Activity, other specified: Secondary | ICD-10-CM | POA: Insufficient documentation

## 2018-03-05 DIAGNOSIS — W231XXA Caught, crushed, jammed, or pinched between stationary objects, initial encounter: Secondary | ICD-10-CM | POA: Insufficient documentation

## 2018-03-05 DIAGNOSIS — S6991XA Unspecified injury of right wrist, hand and finger(s), initial encounter: Secondary | ICD-10-CM | POA: Insufficient documentation

## 2018-03-05 DIAGNOSIS — F17290 Nicotine dependence, other tobacco product, uncomplicated: Secondary | ICD-10-CM | POA: Insufficient documentation

## 2018-03-05 DIAGNOSIS — Y929 Unspecified place or not applicable: Secondary | ICD-10-CM | POA: Insufficient documentation

## 2018-03-05 MED ORDER — NAPROXEN 500 MG PO TABS: 500.0000 mg | ORAL_TABLET | Freq: Two times a day (BID) | ORAL | 0 refills | Status: DC

## 2018-03-05 NOTE — ED Triage Notes (Signed)
Pt reports slamming her right thumb in a door at work. Pt reports that she is unable to move thumb due to pain. Swelling noted to right thumb.

## 2018-03-05 NOTE — ED Provider Notes (Signed)
[facility]-EMERGENCY DEPT Provider Note   CSN: [id number] Arrival date & time: 03/05/18  2005    History   Chief Complaint Chief Complaint  Patient presents with  . Hand Injury    HPI [name] is a 23 y.o. female with a hx of tobacco abuse who presents to the ED with complaints of R thumb pain that began following injury at 1900. Patient states that she got the R thumb slammed in a door resulting in discomfort and swelling. Pain is a 5/10 in severity, worse with movement, no alleviating factors. R hand dominant. Denies numbness, weakness, or wounds. No other areas of injury. Denies chance of pregnancy.      HPI  History reviewed. No pertinent past medical history.  There are no active problems to display for this patient.   Past Surgical History:  Procedure Laterality Date  . ARTHROSCOPIC REPAIR ACL Right 2012     OB History   No obstetric history on file.      Home Medications    Prior to Admission medications   Medication Sig Start Date End Date Taking? Authorizing Provider  albuterol (PROVENTIL HFA;VENTOLIN HFA) 108 (90 Base) MCG/ACT inhaler Inhale 2 puffs into the lungs every 4 (four) hours as needed for wheezing or shortness of breath. 12/06/17   Horton, Mayer Masker, MD  azithromycin (ZITHROMAX) 250 MG tablet Take 1 tablet (250 mg total) by mouth daily. Take first 2 tablets together, then 1 every day until finished. 12/31/14   Barrett, Rolm Gala, PA-C  Chlorphen-Pseudoephed-APAP (THERAFLU FLU/COLD PO) Take by mouth as directed.    [provider]  guaifenesin (ROBITUSSIN) 100 MG/5ML syrup Take 200 mg by mouth 3 (three) times daily as needed for cough.    [provider]  methocarbamol (ROBAXIN) 500 MG tablet Take 1 tablet (500 mg total) by mouth 2 (two) times daily. 01/28/18   Maxwell Caul, PA-C  naproxen (NAPROSYN) 500 MG tablet Take 1 tablet (500 mg total) by mouth 2 (two) times daily. 12/06/17   Horton, Mayer Masker,  MD  ondansetron (ZOFRAN) 4 MG tablet Take 1 tablet (4 mg total) by mouth every 6 (six) hours as needed for nausea or vomiting. 02/11/16   Dione Booze, MD    Family History History reviewed. No pertinent family history.  Social History Social History   Tobacco Use  . Smoking status: Current Some Day Smoker    Types: Cigars  . Smokeless tobacco: Never Used  Substance Use Topics  . Alcohol use: No  . Drug use: No     Allergies   Patient has no known allergies.   Review of Systems Review of Systems  Constitutional: Negative for chills and fever.  Musculoskeletal: Positive for arthralgias (R thumb).  Skin: Negative for wound.  Neurological: Negative for weakness and numbness.     Physical Exam Updated Vital Signs Pulse 71   Temp 99 F (37.2 C) (Oral)   Resp 16   Ht 5\' 6"  (1.676 m)   Wt 67.9 kg   LMP 02/13/2018   SpO2 100%   BMI 24.16 kg/m   Physical Exam Vitals signs and nursing note reviewed.  Constitutional:      General: She is not in acute distress.    Appearance: She is well-developed.  HENT:     Head: Normocephalic and atraumatic.  Eyes:     General:        Right eye: No discharge.        Left eye:  No discharge.     Conjunctiva/sclera: Conjunctivae normal.  Musculoskeletal:     Comments: Right upper extremity: Very mild swelling noted at the right first MCP.  No obvious deformity, ecchymosis, or open wounds.  Patient has intact active range of motion to the wrist as well as all MCP/IP joints including the first IP and MCP joint.  She is tender to palpation over the first MCP as well as the proximal first phalanx.  No other areas of tenderness.  No anatomical snuffbox tenderness.  Skin:    Capillary Refill: Capillary refill takes less than 2 seconds.  Neurological:     Mental Status: She is alert.     Comments: Clear speech.  Sensation grossly intact bilateral upper extremities.  5 out of 5 symmetric grip strength.  Patient able to form thumbs up, cross  second and third digits, and perform okay sign and thumb to fifth digit opposition.  Psychiatric:        Behavior: Behavior normal.        Thought Content: Thought content normal.      ED Treatments / Results  Labs (all labs ordered are listed, but only abnormal results are displayed) Labs Reviewed - No data to display  EKG None  Radiology Dg Finger Thumb Right  Result Date: 03/05/2018 CLINICAL DATA:  First MCP joint pain.  Closed in door. EXAM: RIGHT THUMB 2+V COMPARISON:  None. FINDINGS: There is no evidence of fracture or dislocation. There is no evidence of arthropathy or other focal bone abnormality. Soft tissues are unremarkable IMPRESSION: Negative. Electronically Signed   By: Charlett Nose M.D.   On: 03/05/2018 21:49    Procedures Procedures (including critical care time)  SPLINT APPLICATION Date/Time: 11:00 PM Authorized by: Harvie Heck Consent: Verbal consent obtained. Risks and benefits: risks, benefits and alternatives were discussed Consent given by: patient Splint applied by: orthopedic technician Location details: RUE Splint type: thumb spica brace Post-procedure: The splinted body part was neurovascularly unchanged following the procedure. Patient tolerance: Patient tolerated the procedure well with no immediate complications.  Medications Ordered in ED Medications - No data to display   Initial Impression / Assessment and Plan / ED Course  I have reviewed the triage vital signs and the nursing notes.  Pertinent labs & imaging results that were available during my care of the patient were reviewed by me and considered in my medical decision making (see chart for details).    Patient presents to the ED with complaints of pain to the  R thumb s/p injury shortly PTA. Exam without obvious deformity or open wounds. ROM intact. Tender to palpation over 1st MCP & proximal phalanx. No anatomical snuffbox tenderness. NVI distally. Xray negative for  fracture/dislocation. Therapeutic splint provided. PRICE recommended, naproxen prescription provided. I discussed results, treatment plan, need for follow-up, and return precautions with the patient. Provided opportunity for questions, patient confirmed understanding and are in agreement with plan.     Final Clinical Impressions(s) / ED Diagnoses   Final diagnoses:  Injury of finger of right hand, initial encounter    ED Discharge Orders         Ordered    naproxen (NAPROSYN) 500 MG tablet  2 times daily     03/05/18 2254           Cherly Anderson, PA-C 03/05/18 2300    Tegeler, Canary Brim, MD 03/06/18 [phone]

## 2018-03-05 NOTE — Discharge Instructions (Addendum)
Please read and follow all provided instructions.  You have been seen today for  an injury to your right thumb  Tests performed today include: An x-ray of the affected area - does NOT show any broken bones or dislocations.  Vital signs. See below for your results today.   Home care instructions: -- *PRICE in the first 24-48 hours after injury: Protect (with brace, splint, sling), if given by your provider Rest Ice- Do not apply ice pack directly to your skin, place towel or similar between your skin and ice/ice pack. Apply ice for 20 min, then remove for 40 min while awake Compression- Wear brace, elastic bandage, splint as directed by your provider Elevate affected extremity above the level of your heart when not walking around for the first 24-48 hours   Medications: - Naproxen is a nonsteroidal anti-inflammatory medication that will help with pain and swelling. Be sure to take this medication as prescribed with food, 1 pill every 12 hours,  It should be taken with food, as it can cause stomach upset, and more seriously, stomach bleeding. Do not take other nonsteroidal anti-inflammatory medications with this such as Advil, Motrin, Aleve, Mobic, Goodie Powder, or Motrin.    You make take Tylenol per over the counter dosing with these medications.   We have prescribed you new medication(s) today. Discuss the medications prescribed today with your pharmacist as they can have adverse effects and interactions with your other medicines including over the counter and prescribed medications. Seek medical evaluation if you start to experience new or abnormal symptoms after taking one of these medicines, seek care immediately if you start to experience difficulty breathing, feeling of your throat closing, facial swelling, or rash as these could be indications of a more serious allergic reaction   Follow-up instructions: Please follow-up with your primary care provider or the provided orthopedic  physician (bone specialist) if you continue to have significant pain in 1 week. In this case you may have a more severe injury that requires further care.   Return instructions:  Please return if your digits or extremity are numb or tingling, appear gray or blue, or you have severe pain (also elevate the extremity and loosen splint or wrap if you were given one) Please return if you have redness or fevers.  Please return to the Emergency Department if you experience worsening symptoms.  Please return if you have any other emergent concerns. Additional Information:  Your vital signs today were: BP 117/73    Pulse 78    Temp 99 F (37.2 C) (Oral)    Resp 17    Ht 5\' 6"  (1.676 m)    Wt 67.9 kg    LMP 02/13/2018    SpO2 99%    BMI 24.16 kg/m  If your blood pressure (BP) was elevated above 135/85 this visit, please have this repeated by your doctor within one month. ---------------

## 2018-09-07 ENCOUNTER — Emergency Department (HOSPITAL_COMMUNITY): Payer: BLUE CROSS/BLUE SHIELD

## 2018-09-07 ENCOUNTER — Other Ambulatory Visit: Payer: Self-pay

## 2018-09-07 ENCOUNTER — Encounter (HOSPITAL_COMMUNITY): Payer: Self-pay | Admitting: *Deleted

## 2018-09-07 ENCOUNTER — Emergency Department (HOSPITAL_COMMUNITY)
Admission: EM | Admit: 2018-09-07 | Discharge: 2018-09-07 | Disposition: A | Discharge status: Home / Self Care | Payer: BLUE CROSS/BLUE SHIELD | Attending: Emergency Medicine | Admitting: Emergency Medicine

## 2018-09-07 DIAGNOSIS — F17210 Nicotine dependence, cigarettes, uncomplicated: Secondary | ICD-10-CM | POA: Diagnosis not present

## 2018-09-07 DIAGNOSIS — J4521 Mild intermittent asthma with (acute) exacerbation: Secondary | ICD-10-CM | POA: Insufficient documentation

## 2018-09-07 DIAGNOSIS — R0602 Shortness of breath: Secondary | ICD-10-CM | POA: Diagnosis present

## 2018-09-07 MED ORDER — PREDNISONE 20 MG PO TABS
60.0000 mg | ORAL_TABLET | Freq: Once | ORAL | Status: AC
  Administered 2018-09-07: 60 mg via ORAL
  Filled 2018-09-07: qty 3

## 2018-09-07 MED ORDER — PREDNISONE 20 MG PO TABS: ORAL_TABLET | ORAL | 0 refills | Status: DC

## 2018-09-07 MED ORDER — AEROCHAMBER PLUS FLO-VU MEDIUM MISC
1.0000 | Freq: Once | Status: AC
  Administered 2018-09-07: 15:00:00 1

## 2018-09-07 MED ORDER — ALBUTEROL SULFATE HFA 108 (90 BASE) MCG/ACT IN AERS
2.0000 | INHALATION_SPRAY | RESPIRATORY_TRACT | Status: DC | PRN
  Administered 2018-09-07: 2 via RESPIRATORY_TRACT
  Filled 2018-09-07: qty 6.7

## 2018-09-07 NOTE — ED Provider Notes (Signed)
  Physical Exam  BP 130/80   Pulse 70   Temp 98.7 F (37.1 C) (Oral)   Resp (!) 21   LMP 08/08/2018   SpO2 100%   Physical Exam  ED Course/Procedures     Procedures  MDM  Care assumed at 3 PM.  Patient has history of asthma here with wheezing or shortness of breath .  Had some wheezing initially and was given albuterol and prednisone. Chest x-ray was negative. Signout pending EKG as well as reassessment.  4:09 PM EKG unremarkable. No wheezing on exam now. Stable for discharge. Asked regarding close contacts with COVID and she states that she doesn't have any known contact with COVID. Low risk for COVID and will hold off on testing. Will dc home with prednisone, albuterol    EKG Interpretation  Date/Time:  Thursday September 07 2018 15:01:47 EDT Ventricular Rate:  61 PR Interval:    QRS Duration: 100 QT Interval:  409 QTC Calculation: 412 R Axis:   42 Text Interpretation:  Sinus rhythm Borderline prolonged PR interval RSR' in V1 or V2, probably normal variant No significant change since last tracing Confirmed by Wandra Arthurs [phone]) on 09/07/2018 3:33:17 PM             Drenda Freeze, MD 09/07/18 [phone]

## 2018-09-07 NOTE — ED Provider Notes (Signed)
[facility] Provider Note MRN:  [id number]  Arrival date & time: 09/07/18     Chief Complaint   Shortness of Breath   History of Present Illness   Caroline Richardson is a 23 y.o. year-old female with no pertinent past medical history presenting to the ED with chief complaint of shortness of breath.  Patient has had gradual onset shortness of breath for the past 1 to 2 days, became much worse this morning, associated with productive cough, general malaise.  Denies fever.  Endorsing a chest tightness.  Denies leg pain or swelling, no abdominal pain, no birth control use, no recent travel, no personal history of blood clots.  Symptoms moderate, no exacerbating relieving factors.  Review of Systems  A complete 10 system review of systems was obtained and all systems are negative except as noted in the HPI and PMH.   Patient's Health History   History reviewed. No pertinent past medical history.  Past Surgical History:  Procedure Laterality Date  . ARTHROSCOPIC REPAIR ACL Right 2012    No family history on file.  Social History   Socioeconomic History  . Marital status: Single    Spouse name: Not on file  . Number of children: Not on file  . Years of education: Not on file  . Highest education level: Not on file  Occupational History  . Not on file  Social Needs  . Financial resource strain: Not on file  . Food insecurity    Worry: Not on file    Inability: Not on file  . Transportation needs    Medical: Not on file    Non-medical: Not on file  Tobacco Use  . Smoking status: Current Some Day Smoker    Types: Cigars  . Smokeless tobacco: Never Used  Substance and Sexual Activity  . Alcohol use: No  . Drug use: No  . Sexual activity: Yes  Lifestyle  . Physical activity    Days per week: Not on file    Minutes per session: Not on file  . Stress: Not on file  Relationships  . Social Musicianconnections    Talks on phone: Not on  file    Gets together: Not on file    Attends religious service: Not on file    Active member of club or organization: Not on file    Attends meetings of clubs or organizations: Not on file    Relationship status: Not on file  . Intimate partner violence    Fear of current or ex partner: Not on file    Emotionally abused: Not on file    Physically abused: Not on file    Forced sexual activity: Not on file  Other Topics Concern  . Not on file  Social History Narrative  . Not on file     Physical Exam  Vital Signs and Nursing Notes reviewed Vitals:   09/07/18 1210  BP: 132/67  Pulse: 74  Resp: 18  Temp: 98.7 F (37.1 C)  SpO2: 100%    CONSTITUTIONAL: Well-appearing, NAD NEURO:  Alert and oriented x 3, no focal deficits EYES:  eyes equal and reactive ENT/NECK:  no LAD, no JVD CARDIO: Regular rate, well-perfused, normal S1 and S2 PULM: Diffuse wheezing in all lung fields, no increased work of breathing GI/GU:  normal bowel sounds, non-distended, non-tender MSK/SPINE:  No gross deformities, no edema SKIN:  no rash, atraumatic PSYCH:  Appropriate speech and behavior  Diagnostic and Interventional Summary  Labs Reviewed - No data to display  DG Chest 2 View  Final Result      Medications  predniSONE (DELTASONE) tablet 60 mg (has no administration in time range)  albuterol (VENTOLIN HFA) 108 (90 Base) MCG/ACT inhaler 2 puff (has no administration in time range)  AeroChamber Plus Flo-Vu Medium MISC 1 each (has no administration in time range)     Procedures Critical Care  ED Course and Medical Decision Making  I have reviewed the triage vital signs and the nursing notes.  Pertinent labs & imaging results that were available during my care of the patient were reviewed by me and considered in my medical decision making (see below for details).  Consistent with reactive airway disease, patient thinks that she had childhood asthma but is not sure.  Has a follow-up  appointment scheduled next month for pulmonary function testing.  She is PERC negative, will obtain screening EKG given her chest tightness.  Chest x-ray is without pneumothorax, no pneumonia.  We will provide first dose of prednisone, albuterol inhaler, with improved exam she will be appropriate for discharge with prednisone burst.  Signed out to oncoming provider at shift change.   Barth Kirks. Sedonia Small, MD [place] [email or web address]  Final Clinical Impressions(s) / ED Diagnoses     ICD-10-CM   1. Mild intermittent reactive airway disease with acute exacerbation  J45.21     ED Discharge Orders    None      Discharge Instructions Discussed with and Provided to Patient: Discharge Instructions   None       Maudie Flakes, MD 09/07/18 1510

## 2018-09-07 NOTE — Discharge Instructions (Signed)
Take prednisone as prescribed.   Use albuterol every 4 hrs for wheezing and cough   See your doctor  Return to ER if you have worse shortness of breath, wheezing, cough, fever

## 2018-09-07 NOTE — ED Notes (Signed)
Family at bedside. 

## 2018-09-07 NOTE — ED Triage Notes (Signed)
Pt reports sob with wheezing which started yesterday while she was at work.  She denies sore throat, cough, fever, body aches, or n/v/d.  No hx of Asthma.  Pt do not appear in distress.  She is A&Ox 4.  She reports wheezing this morning.  She is able to complete a sentence without pausing. No use of accessory muscles when breathing.

## 2018-09-07 NOTE — ED Notes (Signed)
Pt ambulatory from triage 

## 2019-03-05 ENCOUNTER — Emergency Department (HOSPITAL_COMMUNITY): Payer: Managed Care, Other (non HMO)

## 2019-03-05 ENCOUNTER — Other Ambulatory Visit: Payer: Self-pay

## 2019-03-05 ENCOUNTER — Emergency Department (HOSPITAL_COMMUNITY)
Admission: EM | Admit: 2019-03-05 | Discharge: 2019-03-05 | Disposition: A | Discharge status: Home / Self Care | Payer: Managed Care, Other (non HMO) | Attending: Emergency Medicine | Admitting: Emergency Medicine

## 2019-03-05 ENCOUNTER — Encounter (HOSPITAL_COMMUNITY): Payer: Self-pay | Admitting: Emergency Medicine

## 2019-03-05 DIAGNOSIS — R0789 Other chest pain: Secondary | ICD-10-CM | POA: Diagnosis not present

## 2019-03-05 DIAGNOSIS — F17210 Nicotine dependence, cigarettes, uncomplicated: Secondary | ICD-10-CM | POA: Diagnosis not present

## 2019-03-05 DIAGNOSIS — R079 Chest pain, unspecified: Secondary | ICD-10-CM | POA: Diagnosis present

## 2019-03-05 LAB — CBC
HCT: 42.2 % (ref 36.0–46.0)
Hemoglobin: 13.9 g/dL (ref 12.0–15.0)
MCH: 30.3 pg (ref 26.0–34.0)
MCHC: 32.9 g/dL (ref 30.0–36.0)
MCV: 91.9 fL (ref 80.0–100.0)
Platelets: 253 10*3/uL (ref 150–400)
RBC: 4.59 MIL/uL (ref 3.87–5.11)
RDW: 13.4 % (ref 11.5–15.5)
WBC: 5.8 10*3/uL (ref 4.0–10.5)
nRBC: 0 % (ref 0.0–0.2)

## 2019-03-05 LAB — BASIC METABOLIC PANEL
Anion gap: 8 (ref 5–15)
BUN: 18 mg/dL (ref 6–20)
CO2: 25 mmol/L (ref 22–32)
Calcium: 9.2 mg/dL (ref 8.9–10.3)
Chloride: 107 mmol/L (ref 98–111)
Creatinine, Ser: 0.91 mg/dL (ref 0.44–1.00)
GFR calc Af Amer: >60 mL/min (ref >60)
GFR calc non Af Amer: >60 mL/min (ref >60)
Glucose, Bld: 110 mg/dL — ABNORMAL HIGH (ref 70–99)
Potassium: 3.9 mmol/L (ref 3.5–5.1)
Sodium: 140 mmol/L (ref 135–145)

## 2019-03-05 LAB — TROPONIN I (HIGH SENSITIVITY)
Troponin I (High Sensitivity): <2 ng/L (ref <18)
Troponin I (High Sensitivity): <2 ng/L (ref <18)

## 2019-03-05 LAB — D-DIMER, QUANTITATIVE: D-Dimer, Quant: <0.27 ug/mL-FEU (ref 0.00–0.50)

## 2019-03-05 LAB — I-STAT BETA HCG BLOOD, ED (MC, WL, AP ONLY): I-stat hCG, quantitative: <5 m[IU]/mL (ref <5)

## 2019-03-05 MED ORDER — IBUPROFEN 800 MG PO TABS: 800.0000 mg | ORAL_TABLET | Freq: Three times a day (TID) | ORAL | 0 refills | Status: DC | PRN

## 2019-03-05 MED ORDER — KETOROLAC TROMETHAMINE 30 MG/ML IJ SOLN
30.0000 mg | Freq: Once | INTRAMUSCULAR | Status: AC
  Administered 2019-03-05: 30 mg via INTRAVENOUS
  Filled 2019-03-05: qty 1

## 2019-03-05 NOTE — ED Triage Notes (Signed)
Pt having left chest pains that are intermittent with SOB and headaches for over week. Reports thought it was covid but test came back negative. Was told had asthma

## 2019-03-05 NOTE — ED Provider Notes (Signed)
[facility]-EMERGENCY DEPT Provider Note   CSN: [id number] Arrival date & time: 03/05/19  1034     History Chief Complaint  Patient presents with  . Chest Pain  . Headache    Caroline Richardson is a 24 y.o. female.  HPI Patient presents to the emergency department with chest discomfort that is left-sided that started 2 weeks ago.  The patient states that is been fairly constant.  She states she has had some intermittent shortness of breath with this.  She states that it mainly hurts when she takes of breath.  The patient states that she did not take any medications prior to arrival for symptoms.  The patient denies headache,blurred vision, neck pain, fever, cough, weakness, numbness, dizziness, anorexia, edema, abdominal pain, nausea, vomiting, diarrhea, rash, back pain, dysuria, hematemesis, bloody stool, near syncope, or syncope.    History reviewed. No pertinent past medical history.  There are no problems to display for this patient.   Past Surgical History:  Procedure Laterality Date  . ARTHROSCOPIC REPAIR ACL Right 2012     OB History   No obstetric history on file.     No family history on file.  Social History   Tobacco Use  . Smoking status: Current Some Day Smoker    Types: Cigars  . Smokeless tobacco: Never Used  Substance Use Topics  . Alcohol use: No  . Drug use: No    Home Medications Prior to Admission medications   Medication Sig Start Date End Date Taking? Authorizing Provider  albuterol (PROVENTIL HFA;VENTOLIN HFA) 108 (90 Base) MCG/ACT inhaler Inhale 2 puffs into the lungs every 4 (four) hours as needed for wheezing or shortness of breath. Patient not taking: Reported on 09/07/2018 12/06/17   Horton, Mayer Masker, MD  azithromycin (ZITHROMAX) 250 MG tablet Take 1 tablet (250 mg total) by mouth daily. Take first 2 tablets together, then 1 every day until finished. Patient not taking: Reported on 09/07/2018 12/31/14   Barrett,  Rolm Gala, PA-C  diphenhydrAMINE (BENADRYL) 25 MG tablet Take 25 mg by mouth at bedtime as needed for allergies.    [provider]  methocarbamol (ROBAXIN) 500 MG tablet Take 1 tablet (500 mg total) by mouth 2 (two) times daily. Patient not taking: Reported on 09/07/2018 01/28/18   Graciella Freer A, PA-C  naproxen (NAPROSYN) 500 MG tablet Take 1 tablet (500 mg total) by mouth 2 (two) times daily. Patient not taking: Reported on 09/07/2018 03/05/18   Petrucelli, Samantha R, PA-C  ondansetron (ZOFRAN) 4 MG tablet Take 1 tablet (4 mg total) by mouth every 6 (six) hours as needed for nausea or vomiting. Patient not taking: Reported on 09/07/2018 02/11/16   Dione Booze, MD  predniSONE (DELTASONE) 20 MG tablet Take 60 mg daily x 2 days then 40 mg daily x 2 days then 20 mg daily x 2 days 09/07/18   Charlynne Pander, MD    Allergies    Patient has no known allergies.  Review of Systems   Review of Systems All other systems negative except as documented in the HPI. All pertinent positives and negatives as reviewed in the HPI. Physical Exam Updated Vital Signs BP 127/85   Pulse 62   Temp 98.5 F (36.9 C) (Oral)   Resp 15   LMP 03/02/2019   SpO2 100%   Physical Exam Vitals and nursing note reviewed.  Constitutional:      General: She is not in acute distress.    Appearance: She  is well-developed.  HENT:     Head: Normocephalic and atraumatic.  Eyes:     Pupils: Pupils are equal, round, and reactive to light.  Cardiovascular:     Rate and Rhythm: Normal rate and regular rhythm.     Heart sounds: Normal heart sounds. No murmur. No friction rub. No gallop.   Pulmonary:     Effort: Pulmonary effort is normal. No respiratory distress.     Breath sounds: Normal breath sounds. No wheezing.  Chest:     Chest wall: Tenderness present.  Abdominal:     General: Bowel sounds are normal. There is no distension.     Palpations: Abdomen is soft.     Tenderness: There is no abdominal  tenderness.  Musculoskeletal:     Cervical back: Normal range of motion and neck supple.  Skin:    General: Skin is warm and dry.     Capillary Refill: Capillary refill takes less than 2 seconds.     Findings: No erythema or rash.  Neurological:     Mental Status: She is alert and oriented to person, place, and time.     Motor: No abnormal muscle tone.     Coordination: Coordination normal.  Psychiatric:        Behavior: Behavior normal.     ED Results / Procedures / Treatments   Labs (all labs ordered are listed, but only abnormal results are displayed) Labs Reviewed  BASIC METABOLIC PANEL - Abnormal; Notable for the following components:      Result Value   Glucose, Bld 110 (*)    All other components within normal limits  CBC  D-DIMER, QUANTITATIVE (NOT AT [facility])  I-STAT BETA HCG BLOOD, ED ([facility], [facility], [facility] ONLY)  TROPONIN I (HIGH SENSITIVITY)  TROPONIN I (HIGH SENSITIVITY)    EKG EKG Interpretation  Date/Time:  Monday March 05 2019 10:41:59 EST Ventricular Rate:  73 PR Interval:    QRS Duration: 88 QT Interval:  399 QTC Calculation: 440 R Axis:   68 Text Interpretation: Sinus rhythm RSR' in V1 or V2, probably normal variant When comapred to prior, more wandering baseline. No STEMI Confirmed by Theda Belfast ([id number]) on 03/05/2019 11:58:23 AM   Radiology DG Chest 2 View  Result Date: 03/05/2019 CLINICAL DATA:  Cough and chest pain EXAM: CHEST - 2 VIEW COMPARISON:  September 07, 2018 FINDINGS: The lungs are clear. The heart size and pulmonary vascularity are normal. No adenopathy. No pneumothorax. No bone lesions. IMPRESSION: Lungs clear.  Cardiac silhouette normal. Electronically Signed   By: Bretta Bang III M.D.   On: 03/05/2019 11:02    Procedures Procedures (including critical care time)  Medications Ordered in ED Medications  ketorolac (TORADOL) 30 MG/ML injection 30 mg (30 mg Intravenous Given 03/05/19 1334)    ED Course  I have reviewed the triage  vital signs and the nursing notes.  Pertinent labs & imaging results that were available during my care of the patient were reviewed by me and considered in my medical decision making (see chart for details).   Patient has a negative D-dimer and is a low risk for PE based off of being PERC negative.  The patient does not have any cough, nasal congestion, body aches or fevers that she is aware of.  At this point I do not feel that Covid is the cause of her chest pain.  I do feel that she has chest wall pain that we will need to treat with NSAIDs.  I  have advised the patient of the plan and all questions were answered. MDM Rules/Calculators/A&P                       Final Clinical Impression(s) / ED Diagnoses Final diagnoses:  None    Rx / DC Orders ED Discharge Orders    None       Dalia Heading, PA-C 03/05/19 1417    Tegeler, Gwenyth Allegra, MD 03/05/19 [phone]

## 2019-03-05 NOTE — Discharge Instructions (Signed)
Return here as needed.  Your testing here today does not show any heart related damage or signs of blood clot.  Your chest x-ray does not show any abnormalities.  Return here for any worsening in your condition.  Follow-up with your primary doctor.

## 2019-11-02 ENCOUNTER — Other Ambulatory Visit: Payer: Self-pay

## 2019-11-02 ENCOUNTER — Ambulatory Visit (HOSPITAL_COMMUNITY)
Admission: EM | Admit: 2019-11-02 | Discharge: 2019-11-02 | Disposition: A | Discharge status: Home / Self Care | Payer: PRIVATE HEALTH INSURANCE | Attending: Family Medicine | Admitting: Family Medicine

## 2019-11-02 ENCOUNTER — Encounter (HOSPITAL_COMMUNITY): Payer: Self-pay

## 2019-11-02 ENCOUNTER — Telehealth (HOSPITAL_COMMUNITY): Payer: Self-pay

## 2019-11-02 DIAGNOSIS — G43009 Migraine without aura, not intractable, without status migrainosus: Secondary | ICD-10-CM | POA: Diagnosis present

## 2019-11-02 DIAGNOSIS — R519 Headache, unspecified: Secondary | ICD-10-CM

## 2019-11-02 DIAGNOSIS — R42 Dizziness and giddiness: Secondary | ICD-10-CM | POA: Insufficient documentation

## 2019-11-02 LAB — CBC WITH DIFFERENTIAL/PLATELET
Abs Immature Granulocytes: 0.03 10*3/uL (ref 0.00–0.07)
Basophils Absolute: 0 10*3/uL (ref 0.0–0.1)
Basophils Relative: 0 %
Eosinophils Absolute: 0 10*3/uL (ref 0.0–0.5)
Eosinophils Relative: 0 %
HCT: 41.7 % (ref 36.0–46.0)
Hemoglobin: 13.9 g/dL (ref 12.0–15.0)
Immature Granulocytes: 1 %
Lymphocytes Relative: 21 %
Lymphs Abs: 1.3 10*3/uL (ref 0.7–4.0)
MCH: 30 pg (ref 26.0–34.0)
MCHC: 33.3 g/dL (ref 30.0–36.0)
MCV: 89.9 fL (ref 80.0–100.0)
Monocytes Absolute: 0.4 10*3/uL (ref 0.1–1.0)
Monocytes Relative: 6 %
Neutro Abs: 4.5 10*3/uL (ref 1.7–7.7)
Neutrophils Relative %: 72 %
Platelets: 228 10*3/uL (ref 150–400)
RBC: 4.64 MIL/uL (ref 3.87–5.11)
RDW: 12.5 % (ref 11.5–15.5)
WBC: 6.2 10*3/uL (ref 4.0–10.5)
nRBC: 0 % (ref 0.0–0.2)

## 2019-11-02 LAB — COMPREHENSIVE METABOLIC PANEL
ALT: 11 U/L (ref 0–44)
AST: 18 U/L (ref 15–41)
Albumin: 4.2 g/dL (ref 3.5–5.0)
Alkaline Phosphatase: 50 U/L (ref 38–126)
Anion gap: 9 (ref 5–15)
BUN: 16 mg/dL (ref 6–20)
CO2: 24 mmol/L (ref 22–32)
Calcium: 9.5 mg/dL (ref 8.9–10.3)
Chloride: 105 mmol/L (ref 98–111)
Creatinine, Ser: 0.98 mg/dL (ref 0.44–1.00)
GFR, Estimated: >60 mL/min (ref >60)
Glucose, Bld: 106 mg/dL — ABNORMAL HIGH (ref 70–99)
Potassium: 4.1 mmol/L (ref 3.5–5.1)
Sodium: 138 mmol/L (ref 135–145)
Total Bilirubin: 0.7 mg/dL (ref 0.3–1.2)
Total Protein: 7.4 g/dL (ref 6.5–8.1)

## 2019-11-02 LAB — POCT URINALYSIS DIPSTICK, ED / UC
Bilirubin Urine: NEGATIVE
Glucose, UA: NEGATIVE mg/dL
Hgb urine dipstick: NEGATIVE
Ketones, ur: NEGATIVE mg/dL
Leukocytes,Ua: NEGATIVE
Nitrite: NEGATIVE
Protein, ur: NEGATIVE mg/dL
Specific Gravity, Urine: >=1.03 (ref 1.005–1.030)
Urobilinogen, UA: 0.2 mg/dL (ref 0.0–1.0)
pH: 5.5 (ref 5.0–8.0)

## 2019-11-02 LAB — POC URINE PREG, ED: Preg Test, Ur: NEGATIVE

## 2019-11-02 MED ORDER — RIZATRIPTAN BENZOATE 10 MG PO TABS: ORAL_TABLET | ORAL | 0 refills | Status: AC

## 2019-11-02 NOTE — ED Triage Notes (Signed)
Pt reports having headache and feeling lightheaded when standing up fast  x 1 week. 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 Denies nausea, fever, cough.

## 2019-11-02 NOTE — ED Provider Notes (Signed)
[facility]    CSN: [id number] Arrival date & time: 11/02/19  0932      History   Chief Complaint Chief Complaint  Patient presents with  . Headache    HPI Caroline Richardson is a 24 y.o. female.   Here today with about a week of frontal headaches with light and sound sensitivity. 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 Does feel like she's dealing with some sinus pressure here and there and endorses hx of migraines which usually improve with OTC pain relievers but these aren't working this time around. Denies N/V, visual changes, syncope, head injury. Also notes some lightheadedness with standing recently which is new for her. Drinks a large jug of water at work and tries to stay well hydrated. Notes the episodes resolve shortly after acclimating to standing. Denies syncope, falls. No pertinent medical hx.      History reviewed. No pertinent past medical history.  There are no problems to display for this patient.   Past Surgical History:  Procedure Laterality Date  . ARTHROSCOPIC REPAIR ACL Right 2012    OB History   No obstetric history on file.      Home Medications    Prior to Admission medications   Medication Sig Start Date End Date Taking? Authorizing Provider  albuterol (PROVENTIL HFA;VENTOLIN HFA) 108 (90 Base) MCG/ACT inhaler Inhale 2 puffs into the lungs every 4 (four) hours as needed for wheezing or shortness of breath. Patient not taking: Reported on 09/07/2018 12/06/17   Horton, Mayer Masker, MD  azithromycin (ZITHROMAX) 250 MG tablet Take 1 tablet (250 mg total) by mouth daily. Take first 2 tablets together, then 1 every day until finished. Patient not taking: Reported on 09/07/2018 12/31/14   Barrett, Rolm Gala, PA-C  diphenhydrAMINE (BENADRYL) 25 MG tablet Take 25 mg by mouth at bedtime as needed for allergies.    [provider]  ibuprofen (ADVIL) 800 MG tablet Take 1 tablet (800 mg total) by  mouth every 8 (eight) hours as needed. 03/05/19   Lawyer, Cristal Deer, PA-C  methocarbamol (ROBAXIN) 500 MG tablet Take 1 tablet (500 mg total) by mouth 2 (two) times daily. Patient not taking: Reported on 09/07/2018 01/28/18   Graciella Freer A, PA-C  naproxen (NAPROSYN) 500 MG tablet Take 1 tablet (500 mg total) by mouth 2 (two) times daily. Patient not taking: Reported on 09/07/2018 03/05/18   Petrucelli, Samantha R, PA-C  ondansetron (ZOFRAN) 4 MG tablet Take 1 tablet (4 mg total) by mouth every 6 (six) hours as needed for nausea or vomiting. Patient not taking: Reported on 09/07/2018 02/11/16   Dione Booze, MD  predniSONE (DELTASONE) 20 MG tablet Take 60 mg daily x 2 days then 40 mg daily x 2 days then 20 mg daily x 2 days 09/07/18   Charlynne Pander, MD  rizatriptan (MAXALT) 10 MG tablet Take 1 tab at onset of migraine symptoms. May repeat in 2 hours if needed. Max of 2 daily 11/02/19   Particia Nearing, PA-C    Family History History reviewed. No pertinent family history.  Social History Social History   Tobacco Use  . Smoking status: Current Some Day Smoker    Types: Cigars  . Smokeless tobacco: Never Used  Vaping Use  . Vaping Use: Never used  Substance Use Topics  . Alcohol use: No  . Drug use: No     Allergies   Patient has no known allergies.  Review of Systems Review of Systems PER HPI   Physical Exam Triage Vital Signs ED Triage Vitals  Enc Vitals Group     BP 11/02/19 1030 104/64     Pulse Rate 11/02/19 1030 83     Resp 11/02/19 1030 17     Temp 11/02/19 1030 98.6 F (37 C)     Temp Source 11/02/19 1030 Oral     SpO2 11/02/19 1030 97 %     Weight --      Height --      Head Circumference --      Peak Flow --      Pain Score 11/02/19 1029 8     Pain Loc --      Pain Edu? --      Excl. in GC? --    Orthostatic VS for the past 24 hrs:  BP- Lying Pulse- Lying BP- Sitting Pulse- Sitting BP- Standing at 0 minutes Pulse- Standing at 0 minutes    11/02/19 1152 132/78 62 128/71 63 121/77 74    Updated Vital Signs BP 104/64 (BP Location: Right Arm)   Pulse 83   Temp 98.6 F (37 C) (Oral)   Resp 17   LMP 10/02/2019 (Exact Date)   SpO2 97%   Visual Acuity Right Eye Distance:   Left Eye Distance:   Bilateral Distance:    Right Eye Near:   Left Eye Near:    Bilateral Near:     Physical Exam Vitals and nursing note reviewed.  Constitutional:      Appearance: Normal appearance. She is not ill-appearing.  HENT:     Head: Atraumatic.     Right Ear: Tympanic membrane normal.     Left Ear: Tympanic membrane normal.     Nose:     Comments: Nasal mucosa significantly erythematous and turbinates edematous b/l    Mouth/Throat:     Mouth: Mucous membranes are moist.     Pharynx: Oropharynx is clear.  Eyes:     Extraocular Movements: Extraocular movements intact.     Conjunctiva/sclera: Conjunctivae normal.  Cardiovascular:     Rate and Rhythm: Normal rate and regular rhythm.     Heart sounds: Normal heart sounds.  Pulmonary:     Effort: Pulmonary effort is normal.     Breath sounds: Normal breath sounds.  Abdominal:     General: Bowel sounds are normal. There is no distension.     Palpations: Abdomen is soft.     Tenderness: There is no abdominal tenderness. There is no guarding.  Musculoskeletal:        General: Normal range of motion.     Cervical back: Normal range of motion and neck supple.  Skin:    General: Skin is warm and dry.  Neurological:     General: No focal deficit present.     Mental Status: She is alert and oriented to person, place, and time.     Motor: No weakness.     Gait: Gait normal.  Psychiatric:        Mood and Affect: Mood normal.        Thought Content: Thought content normal.        Judgment: Judgment normal.      UC Treatments / Results  Labs (all labs ordered are listed, but only abnormal results are displayed) Labs Reviewed  CBC WITH DIFFERENTIAL/PLATELET  COMPREHENSIVE  METABOLIC PANEL  POCT URINALYSIS DIPSTICK, ED / UC  POC URINE PREG, ED  EKG   Radiology No results found.  Procedures Procedures (including critical care time)  Medications Ordered in UC Medications - No data to display  Initial Impression / Assessment and Plan / UC Course  I have reviewed the triage vital signs and the nursing notes.  Pertinent labs & imaging results that were available during my care of the patient were reviewed by me and considered in my medical decision making (see chart for details).     U/A, urine preg, orthostatic vital signs all benign today, exam reassuring. Suspect migraine possibly with seasonal allergy triggers. Trial maxalt prn in addition to OTC pain relievers, start good allergy regimen, stay well hydrated. Work note given, labs pending. F/u with PCP in the next few weeks for recheck, return for re-evaluation if worsening in meantime.   Final Clinical Impressions(s) / UC Diagnoses   Final diagnoses:  Migraine without aura and without status migrainosus, not intractable  Lightheadedness   Discharge Instructions   None    ED Prescriptions    Medication Sig Dispense Auth. Provider   rizatriptan (MAXALT) 10 MG tablet Take 1 tab at onset of migraine symptoms. May repeat in 2 hours if needed. Max of 2 daily 10 tablet Particia Nearing, [place]     PDMP not reviewed this encounter.   Particia Nearing, [place] 11/02/19 1227

## 2019-11-02 NOTE — Telephone Encounter (Signed)
Pt's employer called questioning the work excuse given to pt. Explained to employer that we cannot divulge any medical information. Verified that the letter for work excuse was written by our provider.

## 2021-05-01 ENCOUNTER — Emergency Department (HOSPITAL_BASED_OUTPATIENT_CLINIC_OR_DEPARTMENT_OTHER)
Admission: EM | Admit: 2021-05-01 | Discharge: 2021-05-01 | Disposition: A | Discharge status: Home / Self Care | Payer: Medicaid Other | Attending: Emergency Medicine | Admitting: Emergency Medicine

## 2021-05-01 ENCOUNTER — Other Ambulatory Visit: Payer: Self-pay

## 2021-05-01 ENCOUNTER — Encounter (HOSPITAL_BASED_OUTPATIENT_CLINIC_OR_DEPARTMENT_OTHER): Payer: Self-pay

## 2021-05-01 DIAGNOSIS — B349 Viral infection, unspecified: Secondary | ICD-10-CM | POA: Insufficient documentation

## 2021-05-01 DIAGNOSIS — Z20822 Contact with and (suspected) exposure to covid-19: Secondary | ICD-10-CM | POA: Insufficient documentation

## 2021-05-01 DIAGNOSIS — R519 Headache, unspecified: Secondary | ICD-10-CM

## 2021-05-01 HISTORY — DX: Unspecified asthma, uncomplicated: J45.909

## 2021-05-01 LAB — URINALYSIS, ROUTINE W REFLEX MICROSCOPIC
Bilirubin Urine: NEGATIVE
Glucose, UA: NEGATIVE mg/dL
Hgb urine dipstick: NEGATIVE
Ketones, ur: NEGATIVE mg/dL
Leukocytes,Ua: NEGATIVE
Nitrite: NEGATIVE
Protein, ur: NEGATIVE mg/dL
Specific Gravity, Urine: 1.014 (ref 1.005–1.030)
pH: 7 (ref 5.0–8.0)

## 2021-05-01 LAB — PREGNANCY, URINE: Preg Test, Ur: NEGATIVE

## 2021-05-01 LAB — RESP PANEL BY RT-PCR (FLU A&B, COVID) ARPGX2
Influenza A by PCR: NEGATIVE
Influenza B by PCR: NEGATIVE
SARS Coronavirus 2 by RT PCR: NEGATIVE

## 2021-05-01 MED ORDER — SODIUM CHLORIDE 0.9 % IV BOLUS
500.0000 mL | Freq: Once | INTRAVENOUS | Status: AC
  Administered 2021-05-01: 500 mL via INTRAVENOUS

## 2021-05-01 MED ORDER — DIPHENHYDRAMINE HCL 50 MG/ML IJ SOLN
25.0000 mg | Freq: Once | INTRAMUSCULAR | Status: AC
  Administered 2021-05-01: 25 mg via INTRAVENOUS
  Filled 2021-05-01: qty 1

## 2021-05-01 MED ORDER — PROCHLORPERAZINE EDISYLATE 10 MG/2ML IJ SOLN
10.0000 mg | Freq: Once | INTRAMUSCULAR | Status: AC
  Administered 2021-05-01: 10 mg via INTRAVENOUS
  Filled 2021-05-01: qty 2

## 2021-05-01 NOTE — ED Triage Notes (Signed)
Onset two days of generalized headache.  States also having sinus congestion with cough productive clear NAD ?

## 2021-05-01 NOTE — ED Provider Notes (Signed)
?[facility] EMERGENCY DEPT ?Provider Note ? ? ?CSN: [id number] ?Arrival date & time: 05/01/21  0744 ? ?  ? ?History ? ?Chief Complaint  ?Patient presents with  ? Headache  ? ? ?Caroline Richardson is a 26 y.o. female.  Presents to ER with concern for headache.  Headache ongoing for about 2 days.  Frontal, aching, some light sensitivity, some sound sensitivity.  No numbness, weakness, speech or vision change.  No neck stiffness.  Has had some sinus congestion and a mildly productive cough.  Productive with clear sputum.  Some malaise as well.  Has tried over-the-counter meds without relief.  Has tried some sinus medication over-the-counter without improvement.  States that she has had prior headaches and migraines, feels similar to prior except that his headache seems to be lasting longer than normal. ? ?HPI ? ?  ? ?Home Medications ?Prior to Admission medications   ?Medication Sig Start Date End Date Taking? Authorizing Provider  ?albuterol (PROVENTIL HFA;VENTOLIN HFA) 108 (90 Base) MCG/ACT inhaler Inhale 2 puffs into the lungs every 4 (four) hours as needed for wheezing or shortness of breath. ?Patient not taking: Reported on 09/07/2018 12/06/17   Horton, Mayer Masker, MD  ?azithromycin (ZITHROMAX) 250 MG tablet Take 1 tablet (250 mg total) by mouth daily. Take first 2 tablets together, then 1 every day until finished. ?Patient not taking: Reported on 09/07/2018 12/31/14   Barrett, Rolm Gala, PA-C  ?diphenhydrAMINE (BENADRYL) 25 MG tablet Take 25 mg by mouth at bedtime as needed for allergies.    [provider]  ?ibuprofen (ADVIL) 800 MG tablet Take 1 tablet (800 mg total) by mouth every 8 (eight) hours as needed. 03/05/19   Lawyer, Cristal Deer, PA-C  ?methocarbamol (ROBAXIN) 500 MG tablet Take 1 tablet (500 mg total) by mouth 2 (two) times daily. ?Patient not taking: Reported on 09/07/2018 01/28/18   Graciella Freer A, PA-C  ?naproxen (NAPROSYN) 500 MG tablet Take 1 tablet (500 mg total) by mouth 2  (two) times daily. ?Patient not taking: Reported on 09/07/2018 03/05/18   Petrucelli, Lelon Mast R, PA-C  ?ondansetron (ZOFRAN) 4 MG tablet Take 1 tablet (4 mg total) by mouth every 6 (six) hours as needed for nausea or vomiting. ?Patient not taking: Reported on 09/07/2018 02/11/16   Dione Booze, MD  ?predniSONE (DELTASONE) 20 MG tablet Take 60 mg daily x 2 days then 40 mg daily x 2 days then 20 mg daily x 2 days 09/07/18   Charlynne Pander, MD  ?rizatriptan (MAXALT) 10 MG tablet Take 1 tab at onset of migraine symptoms. May repeat in 2 hours if needed. Max of 2 daily 11/02/19   Particia Nearing, PA-C  ?   ? ?Allergies    ?Patient has no known allergies.   ? ?Review of Systems   ?Review of Systems  ?Constitutional:  Positive for chills and fatigue. Negative for fever.  ?HENT:  Negative for ear pain and sore throat.   ?Eyes:  Negative for pain and visual disturbance.  ?Respiratory:  Negative for cough and shortness of breath.   ?Cardiovascular:  Negative for chest pain and palpitations.  ?Gastrointestinal:  Negative for abdominal pain and vomiting.  ?Genitourinary:  Negative for dysuria and hematuria.  ?Musculoskeletal:  Positive for arthralgias. Negative for back pain.  ?Skin:  Negative for color change and rash.  ?Neurological:  Negative for seizures and syncope.  ?All other systems reviewed and are negative. ? ?Physical Exam ?Updated Vital Signs ?BP (!) 148/108 (BP Location: Right Arm)  Pulse 65   Temp 99.2 ?F (37.3 ?C) (Oral)   Resp 16   Ht 5\' 6"  (1.676 m)   Wt 63.5 kg   LMP 04/11/2021 (Approximate)   SpO2 99%   BMI 22.60 kg/m?  ?Physical Exam ?Vitals and nursing note reviewed.  ?Constitutional:   ?   General: She is not in acute distress. ?   Appearance: She is well-developed.  ?HENT:  ?   Head: Normocephalic and atraumatic.  ?Eyes:  ?   Conjunctiva/sclera: Conjunctivae normal.  ?Cardiovascular:  ?   Rate and Rhythm: Normal rate and regular rhythm.  ?   Heart sounds: No murmur heard. ?Pulmonary:  ?    Effort: Pulmonary effort is normal. No respiratory distress.  ?   Breath sounds: Normal breath sounds.  ?Abdominal:  ?   Palpations: Abdomen is soft.  ?   Tenderness: There is no abdominal tenderness.  ?Musculoskeletal:     ?   General: No swelling.  ?   Cervical back: Normal range of motion and neck supple. No rigidity.  ?Skin: ?   General: Skin is warm and dry.  ?   Capillary Refill: Capillary refill takes less than 2 seconds.  ?Neurological:  ?   Mental Status: She is alert.  ?Psychiatric:     ?   Mood and Affect: Mood normal.  ? ? ?ED Results / Procedures / Treatments   ?Labs ?(all labs ordered are listed, but only abnormal results are displayed) ?Labs Reviewed  ?URINALYSIS, ROUTINE W REFLEX MICROSCOPIC - Abnormal; Notable for the following components:  ?    Result Value  ? Color, Urine COLORLESS (*)   ? All other components within normal limits  ?RESP PANEL BY RT-PCR (FLU A&B, COVID) ARPGX2  ?PREGNANCY, URINE  ? ? ?EKG ?None ? ?Radiology ?No results found. ? ?Procedures ?Procedures  ? ? ?Medications Ordered in ED ?Medications  ?diphenhydrAMINE (BENADRYL) injection 25 mg (25 mg Intravenous Given 05/01/21 0816)  ?prochlorperazine (COMPAZINE) injection 10 mg (10 mg Intravenous Given 05/01/21 0814)  ?sodium chloride 0.9 % bolus 500 mL (500 mLs Intravenous New Bag/Given 05/01/21 0817)  ? ? ?ED Course/ Medical Decision Making/ A&P ?Clinical Course as of 05/01/21 0843  ?Fri May 01, 2021  ?[id number] Rechecked pt, headache has resolved, will dc home [RD]  ?  ?Clinical Course User Index ?[RD] Milagros Lollykstra, Yaritsa Savarino S, MD  ? ?                        ?Medical Decision Making ?Amount and/or Complexity of Data Reviewed ?Labs: ordered. ? ?Risk ?Prescription drug management. ? ? ?26 year old presents to ER with concern for headache.  On exam she is well-appearing in no distress.  Has some associated cough and congestion.  Suspect may be viral URI triggering her headache/migraine today.  Provided symptomatic control, some fluids.  Check  COVID/flu.  Headache has greatly improved.  Feel she is stable for discharge home at this time, advised follow-up with a primary care doctor. ? ? ? ?After the discussed management above, the patient was determined to be safe for discharge.  The patient was in agreement with this plan and all questions regarding their care were answered.  ED return precautions were discussed and the patient will return to the ED with any significant worsening of condition. ? ? ? ? ? ? ? ?Final Clinical Impression(s) / ED Diagnoses ?Final diagnoses:  ?Nonintractable headache, unspecified chronicity pattern, unspecified headache type  ?Viral syndrome  ? ? ?  Rx / DC Orders ?ED Discharge Orders   ? ? None  ? ?  ? ? ?  ?Milagros Loll, MD ?05/01/21 [phone] ? ?

## 2021-05-01 NOTE — Discharge Instructions (Signed)
Follow-up with your primary doctor.  If you have any significant increase in pain, neck stiffness, fever, or other new concerning symptom, come back to ER for reassessment.  Take Tylenol, Motrin as needed for pain control. ?

## 2022-03-02 ENCOUNTER — Telehealth: Payer: Self-pay

## 2022-03-02 NOTE — Telephone Encounter (Signed)
Mychart msg sent. AS, CMA

## 2022-05-11 ENCOUNTER — Emergency Department (HOSPITAL_BASED_OUTPATIENT_CLINIC_OR_DEPARTMENT_OTHER)
Admission: EM | Admit: 2022-05-11 | Discharge: 2022-05-12 | Disposition: A | Discharge status: Home / Self Care | Payer: BLUE CROSS/BLUE SHIELD | Attending: Emergency Medicine | Admitting: Emergency Medicine

## 2022-05-11 ENCOUNTER — Other Ambulatory Visit: Payer: Self-pay

## 2022-05-11 DIAGNOSIS — K0889 Other specified disorders of teeth and supporting structures: Secondary | ICD-10-CM | POA: Diagnosis not present

## 2022-05-11 DIAGNOSIS — K029 Dental caries, unspecified: Secondary | ICD-10-CM | POA: Insufficient documentation

## 2022-05-11 NOTE — ED Triage Notes (Signed)
Patient from home c/o lower left dental pain. Patient states she chipped a tooth last week.

## 2022-05-12 DIAGNOSIS — K0889 Other specified disorders of teeth and supporting structures: Secondary | ICD-10-CM | POA: Diagnosis not present

## 2022-05-12 MED ORDER — AMOXICILLIN 500 MG PO CAPS: 500.0000 mg | ORAL_CAPSULE | Freq: Three times a day (TID) | ORAL | 0 refills | Status: AC

## 2022-05-12 MED ORDER — AMOXICILLIN 500 MG PO CAPS
500.0000 mg | ORAL_CAPSULE | Freq: Once | ORAL | Status: AC
  Administered 2022-05-12: 500 mg via ORAL
  Filled 2022-05-12: qty 1

## 2022-05-12 MED ORDER — NAPROXEN 500 MG PO TABS: 500.0000 mg | ORAL_TABLET | Freq: Two times a day (BID) | ORAL | 0 refills | Status: DC

## 2022-05-12 NOTE — ED Provider Notes (Signed)
   [place] EMERGENCY DEPARTMENT AT [facility]  Provider Note  CSN: [id number] Arrival date & time: 05/11/22 2338  History Chief Complaint  Patient presents with   Dental Pain    Caroline Richardson is a 27 y.o. female with no known medical problems presents for evaluation of tooth ache in her L lower 2nd molar, started about 24 hours ago, no fever or drainage. Planning to see a dentist this week. Had to leave work due to the pain.    Home Medications Prior to Admission medications   Medication Sig Start Date End Date Taking? Authorizing Provider  amoxicillin (AMOXIL) 500 MG capsule Take 1 capsule (500 mg total) by mouth 3 (three) times daily. 05/12/22  Yes Pollyann Savoy, MD  naproxen (NAPROSYN) 500 MG tablet Take 1 tablet (500 mg total) by mouth 2 (two) times daily. 05/12/22   Pollyann Savoy, MD  rizatriptan (MAXALT) 10 MG tablet Take 1 tab at onset of migraine symptoms. May repeat in 2 hours if needed. Max of 2 daily 11/02/19   Particia Nearing, PA-C     Allergies    Patient has no known allergies.   Review of Systems   Review of Systems Please see HPI for pertinent positives and negatives  Physical Exam BP (!) 168/103   Pulse 72   Temp 98.2 F (36.8 C)   Resp 18   Ht 5\' 5"  (1.651 m)   Wt 64.4 kg   LMP 04/05/2022   SpO2 100%   BMI 23.63 kg/m   Physical Exam Vitals and nursing note reviewed.  HENT:     Head: Normocephalic.     Nose: Nose normal.     Mouth/Throat:     Comments: L lower 2nd molar with large dental caries and discoloration. No focal fluctuance or signs of deep tissue infection.  Eyes:     Extraocular Movements: Extraocular movements intact.  Pulmonary:     Effort: Pulmonary effort is normal.  Musculoskeletal:        General: Normal range of motion.     Cervical back: Neck supple.  Skin:    Findings: No rash (on exposed skin).  Neurological:     Mental Status: She is alert and oriented to person, place, and time.   Psychiatric:        Mood and Affect: Mood normal.     ED Results / Procedures / Treatments   EKG None  Procedures Procedures  Medications Ordered in the ED Medications  amoxicillin (AMOXIL) capsule 500 mg (has no administration in time range)    Initial Impression and Plan  Patient with dental caries here for toothache, will treat with Abx, NSAIDs, recommend close dental follow up for definitive care.   ED Course       MDM Rules/Calculators/A&P Medical Decision Making Problems Addressed: Toothache: acute illness or injury  Risk Prescription drug management.     Final Clinical Impression(s) / ED Diagnoses Final diagnoses:  Toothache    Rx / DC Orders ED Discharge Orders          Ordered    naproxen (NAPROSYN) 500 MG tablet  2 times daily        05/12/22 0030    amoxicillin (AMOXIL) 500 MG capsule  3 times daily        05/12/22 0030             Pollyann Savoy, MD 05/12/22 0030

## 2022-10-11 ENCOUNTER — Encounter (HOSPITAL_BASED_OUTPATIENT_CLINIC_OR_DEPARTMENT_OTHER): Payer: Self-pay | Admitting: Emergency Medicine

## 2022-10-11 ENCOUNTER — Other Ambulatory Visit: Payer: Self-pay

## 2022-10-11 ENCOUNTER — Emergency Department (HOSPITAL_BASED_OUTPATIENT_CLINIC_OR_DEPARTMENT_OTHER)
Admission: EM | Admit: 2022-10-11 | Discharge: 2022-10-11 | Discharge status: Against Medical Advice | Payer: Commercial Managed Care - HMO | Attending: Emergency Medicine | Admitting: Emergency Medicine

## 2022-10-11 DIAGNOSIS — R112 Nausea with vomiting, unspecified: Secondary | ICD-10-CM | POA: Insufficient documentation

## 2022-10-11 DIAGNOSIS — R109 Unspecified abdominal pain: Secondary | ICD-10-CM | POA: Insufficient documentation

## 2022-10-11 DIAGNOSIS — Z5321 Procedure and treatment not carried out due to patient leaving prior to being seen by health care provider: Secondary | ICD-10-CM | POA: Diagnosis not present

## 2022-10-11 LAB — COMPREHENSIVE METABOLIC PANEL
ALT: 9 U/L (ref 0–44)
AST: 19 U/L (ref 15–41)
Albumin: 4.4 g/dL (ref 3.5–5.0)
Alkaline Phosphatase: 53 U/L (ref 38–126)
Anion gap: 7 (ref 5–15)
BUN: 16 mg/dL (ref 6–20)
CO2: 28 mmol/L (ref 22–32)
Calcium: 9.4 mg/dL (ref 8.9–10.3)
Chloride: 105 mmol/L (ref 98–111)
Creatinine, Ser: 1.09 mg/dL — ABNORMAL HIGH (ref 0.44–1.00)
GFR, Estimated: >60 mL/min (ref >60)
Glucose, Bld: 102 mg/dL — ABNORMAL HIGH (ref 70–99)
Potassium: 5 mmol/L (ref 3.5–5.1)
Sodium: 140 mmol/L (ref 135–145)
Total Bilirubin: 0.5 mg/dL (ref 0.3–1.2)
Total Protein: 7.5 g/dL (ref 6.5–8.1)

## 2022-10-11 LAB — URINALYSIS, ROUTINE W REFLEX MICROSCOPIC
Bacteria, UA: NONE SEEN
Bilirubin Urine: NEGATIVE
Glucose, UA: NEGATIVE mg/dL
Ketones, ur: NEGATIVE mg/dL
Leukocytes,Ua: NEGATIVE
Nitrite: NEGATIVE
RBC / HPF: >50 RBC/hpf (ref 0–5)
Specific Gravity, Urine: 1.025 (ref 1.005–1.030)
pH: 5.5 (ref 5.0–8.0)

## 2022-10-11 LAB — CBC
HCT: 39.3 % (ref 36.0–46.0)
Hemoglobin: 13.4 g/dL (ref 12.0–15.0)
MCH: 29.8 pg (ref 26.0–34.0)
MCHC: 34.1 g/dL (ref 30.0–36.0)
MCV: 87.5 fL (ref 80.0–100.0)
Platelets: 239 10*3/uL (ref 150–400)
RBC: 4.49 MIL/uL (ref 3.87–5.11)
RDW: 12.3 % (ref 11.5–15.5)
WBC: 6.8 10*3/uL (ref 4.0–10.5)
nRBC: 0 % (ref 0.0–0.2)

## 2022-10-11 LAB — PREGNANCY, URINE: Preg Test, Ur: NEGATIVE

## 2022-10-11 LAB — LIPASE, BLOOD: Lipase: 44 U/L (ref 11–51)

## 2022-10-11 MED ORDER — ONDANSETRON 4 MG PO TBDP: 4.0000 mg | ORAL_TABLET | Freq: Once | ORAL | Status: DC | PRN

## 2022-10-11 MED ORDER — ONDANSETRON HCL 4 MG/2ML IJ SOLN
4.0000 mg | Freq: Once | INTRAMUSCULAR | Status: AC | PRN
  Administered 2022-10-11: 4 mg via INTRAVENOUS
  Filled 2022-10-11: qty 2

## 2022-10-11 NOTE — ED Triage Notes (Signed)
N/v, started this morning No diarrhea Right side into back No urinary symptoms

## 2022-11-11 ENCOUNTER — Emergency Department (HOSPITAL_COMMUNITY): Payer: Commercial Managed Care - HMO

## 2022-11-11 ENCOUNTER — Other Ambulatory Visit: Payer: Self-pay

## 2022-11-11 ENCOUNTER — Emergency Department (HOSPITAL_COMMUNITY)
Admission: EM | Admit: 2022-11-11 | Discharge: 2022-11-11 | Disposition: A | Discharge status: Home / Self Care | Payer: Commercial Managed Care - HMO | Attending: Emergency Medicine | Admitting: Emergency Medicine

## 2022-11-11 ENCOUNTER — Ambulatory Visit (HOSPITAL_COMMUNITY): Payer: Self-pay

## 2022-11-11 ENCOUNTER — Encounter (HOSPITAL_COMMUNITY): Payer: Self-pay

## 2022-11-11 DIAGNOSIS — R0789 Other chest pain: Secondary | ICD-10-CM | POA: Diagnosis not present

## 2022-11-11 DIAGNOSIS — M25511 Pain in right shoulder: Secondary | ICD-10-CM | POA: Diagnosis not present

## 2022-11-11 DIAGNOSIS — J45909 Unspecified asthma, uncomplicated: Secondary | ICD-10-CM | POA: Diagnosis not present

## 2022-11-11 MED ORDER — NAPROXEN 500 MG PO TABS: 500.0000 mg | ORAL_TABLET | Freq: Two times a day (BID) | ORAL | 0 refills | Status: AC

## 2022-11-11 MED ORDER — METHOCARBAMOL 500 MG PO TABS: 500.0000 mg | ORAL_TABLET | Freq: Two times a day (BID) | ORAL | 0 refills | Status: AC

## 2022-11-11 NOTE — Progress Notes (Signed)
Orthopedic Tech Progress Note Patient Details:  Caroline Richardson 1995-12-29 [id number]  Ortho Devices Type of Ortho Device: Shoulder immobilizer Ortho Device/Splint Location: right Ortho Device/Splint Interventions: Ordered, Application, Adjustment   Post Interventions Patient Tolerated: Well Instructions Provided: Care of device, Adjustment of device  Kizzie Fantasia 11/11/2022, 11:24 AM
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
## 2022-11-11 NOTE — Discharge Instructions (Signed)
Take the medications as needed for pain.  Consider following up with orthopedic doctor if symptoms do not improve in the next week.

## 2023-02-06 ENCOUNTER — Encounter (HOSPITAL_COMMUNITY): Payer: Self-pay

## 2023-02-06 ENCOUNTER — Emergency Department (HOSPITAL_COMMUNITY)
Admission: EM | Admit: 2023-02-06 | Discharge: 2023-02-06 | Disposition: A | Discharge status: Home / Self Care | Payer: Commercial Managed Care - HMO | Attending: Student | Admitting: Student

## 2023-02-06 DIAGNOSIS — Z20822 Contact with and (suspected) exposure to covid-19: Secondary | ICD-10-CM | POA: Insufficient documentation

## 2023-02-06 DIAGNOSIS — I10 Essential (primary) hypertension: Secondary | ICD-10-CM | POA: Diagnosis not present

## 2023-02-06 DIAGNOSIS — R112 Nausea with vomiting, unspecified: Secondary | ICD-10-CM | POA: Diagnosis not present

## 2023-02-06 DIAGNOSIS — J45909 Unspecified asthma, uncomplicated: Secondary | ICD-10-CM | POA: Insufficient documentation

## 2023-02-06 DIAGNOSIS — R110 Nausea: Secondary | ICD-10-CM | POA: Diagnosis not present

## 2023-02-06 LAB — CBC
HCT: 40.4 % (ref 36.0–46.0)
Hemoglobin: 13.7 g/dL (ref 12.0–15.0)
MCH: 29.8 pg (ref 26.0–34.0)
MCHC: 33.9 g/dL (ref 30.0–36.0)
MCV: 87.8 fL (ref 80.0–100.0)
Platelets: 255 10*3/uL (ref 150–400)
RBC: 4.6 MIL/uL (ref 3.87–5.11)
RDW: 12.9 % (ref 11.5–15.5)
WBC: 7.3 10*3/uL (ref 4.0–10.5)
nRBC: 0 % (ref 0.0–0.2)

## 2023-02-06 LAB — COMPREHENSIVE METABOLIC PANEL
ALT: 11 U/L (ref 0–44)
AST: 19 U/L (ref 15–41)
Albumin: 4.5 g/dL (ref 3.5–5.0)
Alkaline Phosphatase: 46 U/L (ref 38–126)
Anion gap: 9 (ref 5–15)
BUN: 10 mg/dL (ref 6–20)
CO2: 23 mmol/L (ref 22–32)
Calcium: 9.2 mg/dL (ref 8.9–10.3)
Chloride: 103 mmol/L (ref 98–111)
Creatinine, Ser: 0.71 mg/dL (ref 0.44–1.00)
GFR, Estimated: >60 mL/min (ref >60)
Glucose, Bld: 133 mg/dL — ABNORMAL HIGH (ref 70–99)
Potassium: 3.2 mmol/L — ABNORMAL LOW (ref 3.5–5.1)
Sodium: 135 mmol/L (ref 135–145)
Total Bilirubin: 0.8 mg/dL (ref 0.0–1.2)
Total Protein: 8.6 g/dL — ABNORMAL HIGH (ref 6.5–8.1)

## 2023-02-06 LAB — URINALYSIS, ROUTINE W REFLEX MICROSCOPIC
Bilirubin Urine: NEGATIVE
Glucose, UA: NEGATIVE mg/dL
Hgb urine dipstick: NEGATIVE
Ketones, ur: 20 mg/dL — AB
Leukocytes,Ua: NEGATIVE
Nitrite: NEGATIVE
Protein, ur: NEGATIVE mg/dL
Specific Gravity, Urine: 1.019 (ref 1.005–1.030)
pH: 5 (ref 5.0–8.0)

## 2023-02-06 LAB — RESP PANEL BY RT-PCR (RSV, FLU A&B, COVID)  RVPGX2
Influenza A by PCR: NEGATIVE
Influenza B by PCR: NEGATIVE
Resp Syncytial Virus by PCR: NEGATIVE
SARS Coronavirus 2 by RT PCR: NEGATIVE

## 2023-02-06 LAB — PREGNANCY, URINE: Preg Test, Ur: NEGATIVE

## 2023-02-06 LAB — LIPASE, BLOOD: Lipase: 30 U/L (ref 11–51)

## 2023-02-06 MED ORDER — ONDANSETRON 4 MG PO TBDP: 4.0000 mg | ORAL_TABLET | Freq: Three times a day (TID) | ORAL | 0 refills | Status: AC | PRN

## 2023-02-06 MED ORDER — ONDANSETRON 4 MG PO TBDP
4.0000 mg | ORAL_TABLET | Freq: Once | ORAL | Status: AC
  Administered 2023-02-06: 4 mg via ORAL
  Filled 2023-02-06: qty 1

## 2023-02-06 NOTE — ED Provider Notes (Signed)
[place] EMERGENCY DEPARTMENT AT [facility] Provider Note  CSN: [id number] Arrival date & time: 02/06/23 1696  Chief Complaint(s) Vomiting  HPI Caroline Richardson is a 28 y.o. female with PMH asthma who presents emergency room for evaluation of nausea and vomiting.  States that she has had multiple sick contacts at work who have all been diagnosed with COVID or flu.  Multiple episodes of nonbloody nonbilious emesis.  Endorses mild epigastric abdominal pain but denies chest pain, shortness of breath, headache, fever or other systemic symptoms.   Past Medical History Past Medical History:  Diagnosis Date   Asthma    There are no active problems to display for this patient.  Home Medication(s) Prior to Admission medications   Medication Sig Start Date End Date Taking? Authorizing Provider  ondansetron (ZOFRAN-ODT) 4 MG disintegrating tablet Take 1 tablet (4 mg total) by mouth every 8 (eight) hours as needed for nausea or vomiting. 02/06/23  Yes Aldin Drees, MD  amoxicillin (AMOXIL) 500 MG capsule Take 1 capsule (500 mg total) by mouth 3 (three) times daily. 05/12/22   Pollyann Savoy, MD  methocarbamol (ROBAXIN) 500 MG tablet Take 1 tablet (500 mg total) by mouth 2 (two) times daily. 11/11/22   Linwood Dibbles, MD  naproxen (NAPROSYN) 500 MG tablet Take 1 tablet (500 mg total) by mouth 2 (two) times daily. 11/11/22   Linwood Dibbles, MD  rizatriptan (MAXALT) 10 MG tablet Take 1 tab at onset of migraine symptoms. May repeat in 2 hours if needed. Max of 2 daily 11/02/19   Particia Nearing, PA-C                                                                                                                                    Past Surgical History Past Surgical History:  Procedure Laterality Date   ARTHROSCOPIC REPAIR ACL Right 01/11/2010   KNEE ARTHROSCOPY Bilateral    Family History History reviewed. No pertinent family history.  Social History Social History   Tobacco  Use   Smoking status: Some Days    Types: Cigars   Smokeless tobacco: Never  Vaping Use   Vaping status: Never Used  Substance Use Topics   Alcohol use: No   Drug use: No   Allergies Patient has no known allergies.  Review of Systems Review of Systems  Gastrointestinal:  Positive for abdominal pain, nausea and vomiting.    Physical Exam Vital Signs  I have reviewed the triage vital signs BP (!) 147/109 (BP Location: Right Arm)   Pulse 94   Temp 98.9 F (37.2 C)   Resp 16   LMP 01/06/2023 (Approximate)   SpO2 100%   Physical Exam Vitals and nursing note reviewed.  Constitutional:      General: She is not in acute distress.    Appearance: She is well-developed.  HENT:     Head: Normocephalic and atraumatic.  Eyes:  Conjunctiva/sclera: Conjunctivae normal.  Cardiovascular:     Rate and Rhythm: Normal rate and regular rhythm.     Heart sounds: No murmur heard. Pulmonary:     Effort: Pulmonary effort is normal. No respiratory distress.     Breath sounds: Normal breath sounds.  Abdominal:     Palpations: Abdomen is soft.     Tenderness: There is no abdominal tenderness.  Musculoskeletal:        General: No swelling.     Cervical back: Neck supple.  Skin:    General: Skin is warm and dry.     Capillary Refill: Capillary refill takes less than 2 seconds.  Neurological:     Mental Status: She is alert.  Psychiatric:        Mood and Affect: Mood normal.     ED Results and Treatments Labs (all labs ordered are listed, but only abnormal results are displayed) Labs Reviewed  COMPREHENSIVE METABOLIC PANEL - Abnormal; Notable for the following components:      Result Value   Potassium 3.2 (*)    Glucose, Bld 133 (*)    Total Protein 8.6 (*)    All other components within normal limits  URINALYSIS, ROUTINE W REFLEX MICROSCOPIC - Abnormal; Notable for the following components:   APPearance HAZY (*)    Ketones, ur 20 (*)    All other components within normal  limits  RESP PANEL BY RT-PCR (RSV, FLU A&B, COVID)  RVPGX2  LIPASE, BLOOD  CBC  PREGNANCY, URINE  HCG, SERUM, QUALITATIVE                                                                                                                          Radiology No results found.  Pertinent labs & imaging results that were available during my care of the patient were reviewed by me and considered in my medical decision making (see MDM for details).  Medications Ordered in ED Medications  ondansetron (ZOFRAN-ODT) disintegrating tablet 4 mg (4 mg Oral Given 02/06/23 0341)                                                                                                                                     Procedures Procedures  (including critical care time)  Medical Decision Making / ED Course   This patient presents to the ED for concern of nausea and vomiting, this involves an  extensive number of treatment options, and is a complaint that carries with it a high risk of complications and morbidity.  The differential diagnosis includes gastroenteritis, COVID-19, influenza, RSV, gastritis, pancreatitis  MDM: Patient seen emergency room for evaluation of abdominal pain nausea and vomiting.  Physical exam largely unremarkable with a soft benign and nontender abdomen.  Laboratory evaluation with some mild hypokalemia to 3.2 but is otherwise unremarkable.  Pregnancy negative.  COVID, flu, RSV negative.  Urinalysis unremarkable.  Patient given ODT Zofran and on reevaluation she is able to tolerate p.o. without difficulty.  Symptoms improved on reevaluation.  Presentation consistent with gastroenteritis and at this time patient does not meet inpatient criteria for admission.  ODT Zofran sent to pharmacy and patient discharged with outpatient follow-up.  Return precautions given of which she voiced understanding   Additional history obtained:  -External records from outside source obtained and reviewed  including: Chart review including previous notes, labs, imaging, consultation notes   Lab Tests: -I ordered, reviewed, and interpreted labs.   The pertinent results include:   Labs Reviewed  COMPREHENSIVE METABOLIC PANEL - Abnormal; Notable for the following components:      Result Value   Potassium 3.2 (*)    Glucose, Bld 133 (*)    Total Protein 8.6 (*)    All other components within normal limits  URINALYSIS, ROUTINE W REFLEX MICROSCOPIC - Abnormal; Notable for the following components:   APPearance HAZY (*)    Ketones, ur 20 (*)    All other components within normal limits  RESP PANEL BY RT-PCR (RSV, FLU A&B, COVID)  RVPGX2  LIPASE, BLOOD  CBC  PREGNANCY, URINE  HCG, SERUM, QUALITATIVE      Medicines ordered and prescription drug management: Meds ordered this encounter  Medications   ondansetron (ZOFRAN-ODT) disintegrating tablet 4 mg   ondansetron (ZOFRAN-ODT) 4 MG disintegrating tablet    Sig: Take 1 tablet (4 mg total) by mouth every 8 (eight) hours as needed for nausea or vomiting.    Dispense:  20 tablet    Refill:  0    -I have reviewed the patients home medicines and have made adjustments as needed  Critical interventions none   Cardiac Monitoring: The patient was maintained on a cardiac monitor.  I personally viewed and interpreted the cardiac monitored which showed an underlying rhythm of: NSR  Social Determinants of Health:  Factors impacting patients care include: none   Reevaluation: After the interventions noted above, I reevaluated the patient and found that they have :improved  Co morbidities that complicate the patient evaluation  Past Medical History:  Diagnosis Date   Asthma       Dispostion: I considered admission for this patient, but at this time she does not meet inpatient criteria for admission and will be discharged with outpatient follow-up     Final Clinical Impression(s) / ED Diagnoses Final diagnoses:  Nausea and  vomiting, unspecified vomiting type     @PCDICTATION @    Kameryn Tisdel, Wyn Forster, MD 02/06/23 [phone]

## 2023-02-06 NOTE — ED Triage Notes (Signed)
PT  BIB EMS from home, c/o n/v endorses sick contacts.
